# Patient Record
Sex: Male | Born: 1955 | Race: White | Hispanic: No | Marital: Married | Smoking: Former smoker
Health system: Southern US, Community
[De-identification: ages and names within clinical notes are randomized; demographics above are authoritative.]

## PROBLEM LIST (undated history)

## (undated) DIAGNOSIS — I10 Essential (primary) hypertension: Secondary | ICD-10-CM

## (undated) DIAGNOSIS — I209 Angina pectoris, unspecified: Secondary | ICD-10-CM

## (undated) DIAGNOSIS — I219 Acute myocardial infarction, unspecified: Secondary | ICD-10-CM

## (undated) DIAGNOSIS — K219 Gastro-esophageal reflux disease without esophagitis: Secondary | ICD-10-CM

## (undated) DIAGNOSIS — G4730 Sleep apnea, unspecified: Secondary | ICD-10-CM

## (undated) HISTORY — PX: ROTATOR CUFF REPAIR: SHX139

## (undated) HISTORY — PX: JOINT REPLACEMENT: SHX530

---

## 1998-07-01 ENCOUNTER — Encounter: Payer: Self-pay | Admitting: Internal Medicine

## 1998-07-01 ENCOUNTER — Ambulatory Visit: Admission: RE | Admit: 1998-07-01 | Discharge: 1998-07-01 | Payer: Self-pay | Admitting: Internal Medicine

## 1999-06-15 ENCOUNTER — Emergency Department (HOSPITAL_COMMUNITY): Admission: EM | Admit: 1999-06-15 | Discharge: 1999-06-15 | Payer: Self-pay | Admitting: Family Medicine

## 1999-06-15 ENCOUNTER — Encounter: Payer: Self-pay | Admitting: Family Medicine

## 2000-04-30 ENCOUNTER — Emergency Department (HOSPITAL_COMMUNITY): Admission: EM | Admit: 2000-04-30 | Discharge: 2000-04-30 | Payer: Self-pay | Admitting: Emergency Medicine

## 2000-04-30 ENCOUNTER — Encounter: Payer: Self-pay | Admitting: Emergency Medicine

## 2000-08-08 HISTORY — PX: CORONARY STENT PLACEMENT: SHX1402

## 2001-03-05 ENCOUNTER — Encounter: Payer: Self-pay | Admitting: Internal Medicine

## 2001-03-05 ENCOUNTER — Inpatient Hospital Stay (HOSPITAL_COMMUNITY): Admission: EM | Admit: 2001-03-05 | Discharge: 2001-03-08 | Payer: Self-pay | Admitting: Emergency Medicine

## 2001-03-15 ENCOUNTER — Ambulatory Visit (HOSPITAL_COMMUNITY): Admission: RE | Admit: 2001-03-15 | Discharge: 2001-03-15 | Payer: Self-pay | Admitting: Cardiology

## 2001-03-15 ENCOUNTER — Encounter: Payer: Self-pay | Admitting: Cardiology

## 2002-01-09 ENCOUNTER — Ambulatory Visit (HOSPITAL_COMMUNITY): Admission: RE | Admit: 2002-01-09 | Discharge: 2002-01-09 | Payer: Self-pay | Admitting: Urology

## 2002-03-25 ENCOUNTER — Encounter: Payer: Self-pay | Admitting: *Deleted

## 2002-03-25 ENCOUNTER — Inpatient Hospital Stay (HOSPITAL_COMMUNITY): Admission: EM | Admit: 2002-03-25 | Discharge: 2002-03-27 | Payer: Self-pay | Admitting: *Deleted

## 2004-07-12 ENCOUNTER — Ambulatory Visit: Payer: Self-pay | Admitting: Cardiology

## 2004-07-14 ENCOUNTER — Ambulatory Visit: Payer: Self-pay | Admitting: Cardiology

## 2004-07-29 ENCOUNTER — Ambulatory Visit: Payer: Self-pay

## 2004-09-06 ENCOUNTER — Ambulatory Visit: Payer: Self-pay | Admitting: Cardiology

## 2004-09-09 ENCOUNTER — Ambulatory Visit: Payer: Self-pay | Admitting: Cardiology

## 2004-12-06 ENCOUNTER — Ambulatory Visit: Payer: Self-pay | Admitting: *Deleted

## 2004-12-09 ENCOUNTER — Ambulatory Visit: Payer: Self-pay | Admitting: Cardiology

## 2005-03-28 ENCOUNTER — Ambulatory Visit: Payer: Self-pay | Admitting: Cardiology

## 2005-03-31 ENCOUNTER — Ambulatory Visit: Payer: Self-pay | Admitting: Cardiology

## 2005-05-23 ENCOUNTER — Ambulatory Visit: Payer: Self-pay | Admitting: Cardiology

## 2005-05-26 ENCOUNTER — Ambulatory Visit: Payer: Self-pay | Admitting: Cardiology

## 2005-06-13 ENCOUNTER — Ambulatory Visit: Payer: Self-pay | Admitting: Internal Medicine

## 2005-11-07 ENCOUNTER — Ambulatory Visit: Payer: Self-pay | Admitting: Cardiology

## 2005-11-15 ENCOUNTER — Ambulatory Visit: Payer: Self-pay | Admitting: Cardiology

## 2006-02-28 ENCOUNTER — Ambulatory Visit: Payer: Self-pay | Admitting: Cardiology

## 2006-03-02 ENCOUNTER — Ambulatory Visit: Payer: Self-pay | Admitting: Internal Medicine

## 2006-06-26 ENCOUNTER — Inpatient Hospital Stay (HOSPITAL_COMMUNITY): Admission: EM | Admit: 2006-06-26 | Discharge: 2006-06-27 | Payer: Self-pay | Admitting: Emergency Medicine

## 2006-06-26 ENCOUNTER — Ambulatory Visit: Payer: Self-pay | Admitting: Cardiology

## 2006-07-13 ENCOUNTER — Ambulatory Visit: Payer: Self-pay | Admitting: Cardiology

## 2006-08-29 ENCOUNTER — Ambulatory Visit: Payer: Self-pay | Admitting: Cardiology

## 2006-08-29 LAB — CONVERTED CEMR LAB
AST: 34 units/L (ref 0–37)
Albumin: 4.6 g/dL (ref 3.5–5.2)
Alkaline Phosphatase: 117 units/L (ref 39–117)
Cholesterol: 99 mg/dL (ref 0–200)
HDL: 43.2 mg/dL (ref >39.0)
LDL Cholesterol: 47 mg/dL (ref 0–99)
Total Bilirubin: 1.1 mg/dL (ref 0.3–1.2)
Total Protein: 8 g/dL (ref 6.0–8.3)

## 2006-08-31 ENCOUNTER — Ambulatory Visit: Payer: Self-pay | Admitting: Internal Medicine

## 2006-12-11 ENCOUNTER — Ambulatory Visit: Payer: Self-pay | Admitting: Cardiology

## 2006-12-11 LAB — CONVERTED CEMR LAB
Bilirubin, Direct: 0.2 mg/dL (ref 0.0–0.3)
Cholesterol: 97 mg/dL (ref 0–200)
HDL: 37.4 mg/dL — ABNORMAL LOW (ref >39.0)
Total CHOL/HDL Ratio: 2.6
VLDL: 11 mg/dL (ref 0–40)

## 2006-12-14 ENCOUNTER — Ambulatory Visit: Payer: Self-pay | Admitting: Cardiology

## 2007-05-16 ENCOUNTER — Ambulatory Visit (HOSPITAL_COMMUNITY): Admission: RE | Admit: 2007-05-16 | Discharge: 2007-05-16 | Payer: Self-pay | Admitting: Orthopaedic Surgery

## 2007-06-18 ENCOUNTER — Ambulatory Visit: Payer: Self-pay | Admitting: Cardiology

## 2007-06-18 LAB — CONVERTED CEMR LAB
ALT: 62 units/L — ABNORMAL HIGH (ref 0–53)
AST: 41 units/L — ABNORMAL HIGH (ref 0–37)
Albumin: 4.4 g/dL (ref 3.5–5.2)
HDL: 43.6 mg/dL (ref >39.0)
Total CHOL/HDL Ratio: 2.3
Total Protein: 7.4 g/dL (ref 6.0–8.3)
Triglycerides: 49 mg/dL (ref 0–149)
VLDL: 10 mg/dL (ref 0–40)

## 2007-06-21 ENCOUNTER — Ambulatory Visit: Payer: Self-pay | Admitting: Cardiology

## 2007-07-17 ENCOUNTER — Ambulatory Visit: Payer: Self-pay | Admitting: Cardiology

## 2007-12-03 ENCOUNTER — Ambulatory Visit: Payer: Self-pay | Admitting: Cardiology

## 2007-12-03 LAB — CONVERTED CEMR LAB
Albumin: 4.2 g/dL (ref 3.5–5.2)
Alkaline Phosphatase: 98 units/L (ref 39–117)
Bilirubin, Direct: 0.1 mg/dL (ref 0.0–0.3)
Cholesterol: 107 mg/dL (ref 0–200)
LDL Cholesterol: 57 mg/dL (ref 0–99)

## 2007-12-06 ENCOUNTER — Ambulatory Visit: Payer: Self-pay | Admitting: Cardiology

## 2008-06-02 ENCOUNTER — Ambulatory Visit: Payer: Self-pay | Admitting: Cardiology

## 2008-06-02 LAB — CONVERTED CEMR LAB
Cholesterol: 104 mg/dL (ref 0–200)
HDL: 37.8 mg/dL — ABNORMAL LOW (ref >39.0)
LDL Cholesterol: 57 mg/dL (ref 0–99)
Total Bilirubin: 1.2 mg/dL (ref 0.3–1.2)
Total CHOL/HDL Ratio: 2.8
VLDL: 9 mg/dL (ref 0–40)

## 2008-06-05 ENCOUNTER — Ambulatory Visit: Payer: Self-pay | Admitting: Cardiovascular Disease

## 2008-07-10 ENCOUNTER — Ambulatory Visit: Payer: Self-pay | Admitting: Cardiology

## 2008-12-01 ENCOUNTER — Ambulatory Visit: Payer: Self-pay | Admitting: Cardiology

## 2008-12-01 LAB — CONVERTED CEMR LAB
ALT: 34 units/L (ref 0–53)
AST: 27 units/L (ref 0–37)
Albumin: 4.4 g/dL (ref 3.5–5.2)
Alkaline Phosphatase: 102 units/L (ref 39–117)
Bilirubin, Direct: 0.2 mg/dL (ref 0.0–0.3)
Cholesterol: 99 mg/dL (ref 0–200)
HDL: 38.3 mg/dL — ABNORMAL LOW (ref >39.00)
LDL Cholesterol: 52 mg/dL (ref 0–99)
Total Bilirubin: 0.9 mg/dL (ref 0.3–1.2)
Total CHOL/HDL Ratio: 3
Total Protein: 7.1 g/dL (ref 6.0–8.3)
Triglycerides: 43 mg/dL (ref 0.0–149.0)
VLDL: 8.6 mg/dL (ref 0.0–40.0)

## 2008-12-11 ENCOUNTER — Ambulatory Visit: Payer: Self-pay | Admitting: Internal Medicine

## 2008-12-15 ENCOUNTER — Encounter: Payer: Self-pay | Admitting: Cardiology

## 2008-12-15 DIAGNOSIS — I10 Essential (primary) hypertension: Secondary | ICD-10-CM | POA: Insufficient documentation

## 2008-12-15 DIAGNOSIS — E785 Hyperlipidemia, unspecified: Secondary | ICD-10-CM

## 2008-12-15 DIAGNOSIS — I2510 Atherosclerotic heart disease of native coronary artery without angina pectoris: Secondary | ICD-10-CM | POA: Insufficient documentation

## 2008-12-15 DIAGNOSIS — E663 Overweight: Secondary | ICD-10-CM | POA: Insufficient documentation

## 2009-03-18 ENCOUNTER — Telehealth (INDEPENDENT_AMBULATORY_CARE_PROVIDER_SITE_OTHER): Payer: Self-pay | Admitting: *Deleted

## 2009-06-02 ENCOUNTER — Ambulatory Visit: Payer: Self-pay | Admitting: Cardiology

## 2009-06-03 LAB — CONVERTED CEMR LAB
ALT: 42 units/L (ref 0–53)
AST: 31 units/L (ref 0–37)
Albumin: 4.1 g/dL (ref 3.5–5.2)
Alkaline Phosphatase: 102 units/L (ref 39–117)
Bilirubin, Direct: 0.2 mg/dL (ref 0.0–0.3)
LDL Cholesterol: 44 mg/dL (ref 0–99)
Total Bilirubin: 1 mg/dL (ref 0.3–1.2)
Total CHOL/HDL Ratio: 2
Total Protein: 7.3 g/dL (ref 6.0–8.3)

## 2009-06-04 ENCOUNTER — Ambulatory Visit: Payer: Self-pay | Admitting: Cardiology

## 2009-07-10 ENCOUNTER — Ambulatory Visit: Payer: Self-pay | Admitting: Cardiology

## 2009-08-25 ENCOUNTER — Ambulatory Visit: Payer: Self-pay | Admitting: Cardiology

## 2009-08-27 ENCOUNTER — Ambulatory Visit: Payer: Self-pay | Admitting: Cardiology

## 2009-08-28 LAB — CONVERTED CEMR LAB
Cholesterol: 156 mg/dL (ref 0–200)
Total Bilirubin: 0.9 mg/dL (ref 0.3–1.2)
Total CHOL/HDL Ratio: 4
Total Protein: 7.2 g/dL (ref 6.0–8.3)
VLDL: 13.4 mg/dL (ref 0.0–40.0)

## 2009-10-08 ENCOUNTER — Inpatient Hospital Stay (HOSPITAL_COMMUNITY): Admission: RE | Admit: 2009-10-08 | Discharge: 2009-10-10 | Payer: Self-pay | Admitting: Orthopaedic Surgery

## 2010-02-04 ENCOUNTER — Inpatient Hospital Stay (HOSPITAL_COMMUNITY): Admission: RE | Admit: 2010-02-04 | Discharge: 2010-02-06 | Payer: Self-pay | Admitting: Orthopaedic Surgery

## 2010-04-05 ENCOUNTER — Telehealth (INDEPENDENT_AMBULATORY_CARE_PROVIDER_SITE_OTHER): Payer: Self-pay | Admitting: *Deleted

## 2010-04-05 ENCOUNTER — Ambulatory Visit: Payer: Self-pay | Admitting: Cardiology

## 2010-04-07 ENCOUNTER — Ambulatory Visit: Payer: Self-pay | Admitting: Internal Medicine

## 2010-04-07 DIAGNOSIS — G4733 Obstructive sleep apnea (adult) (pediatric): Secondary | ICD-10-CM | POA: Insufficient documentation

## 2010-04-07 DIAGNOSIS — J309 Allergic rhinitis, unspecified: Secondary | ICD-10-CM | POA: Insufficient documentation

## 2010-04-07 LAB — CONVERTED CEMR LAB
ALT: 37 units/L (ref 0–53)
AST: 34 units/L (ref 0–37)
Albumin: 4.3 g/dL (ref 3.5–5.2)
Alkaline Phosphatase: 105 units/L (ref 39–117)
Bilirubin, Direct: 0.1 mg/dL (ref 0.0–0.3)
HDL: 50.5 mg/dL (ref >39.00)
LDL Cholesterol: 66 mg/dL (ref 0–99)
Total Protein: 6.9 g/dL (ref 6.0–8.3)

## 2010-04-08 ENCOUNTER — Ambulatory Visit: Payer: Self-pay | Admitting: Internal Medicine

## 2010-04-27 ENCOUNTER — Encounter: Payer: Self-pay | Admitting: Internal Medicine

## 2010-06-27 IMAGING — CR DG CHEST 2V
2 series · 2 of 2 positions shown · non-contrast
Comparison: 06/26/2006

CLINICAL DATA: Preop for left total knee replacement.
Hypertension.

CHEST - 2 VIEW

[view not recorded (1 of 2)]
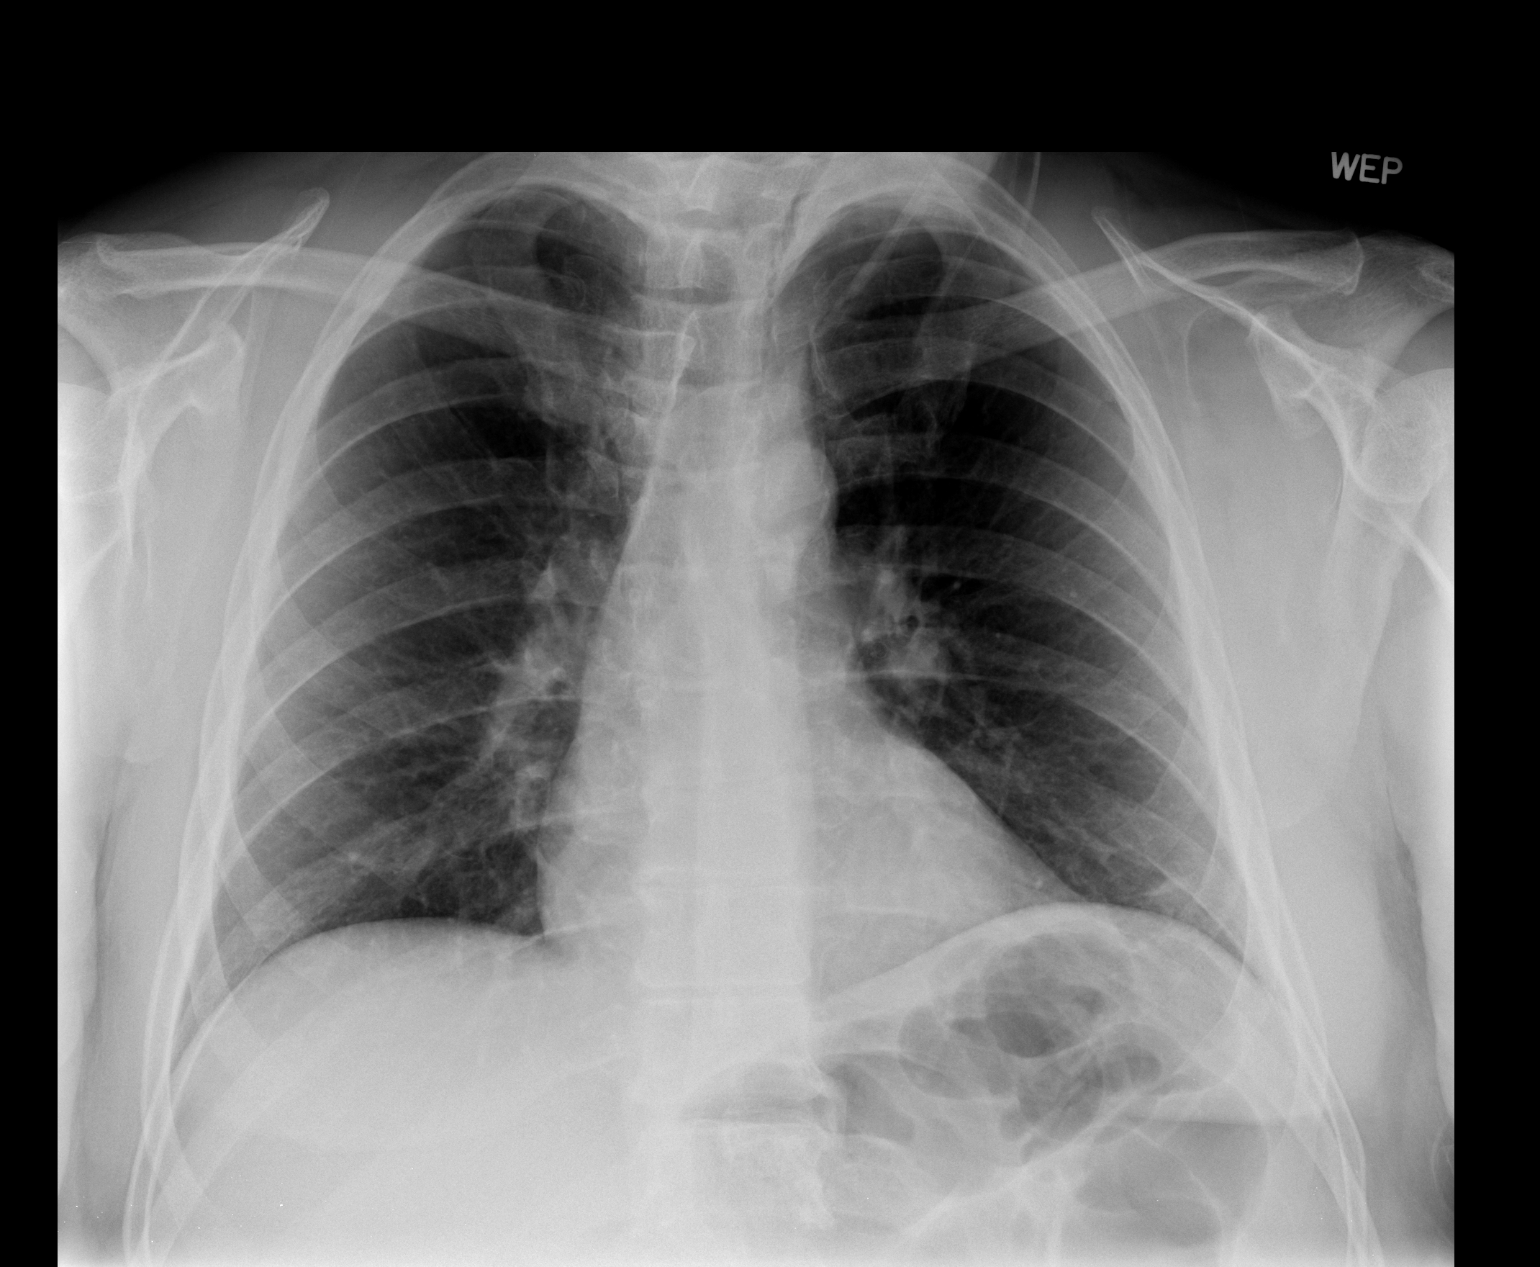

[view not recorded (2 of 2)]
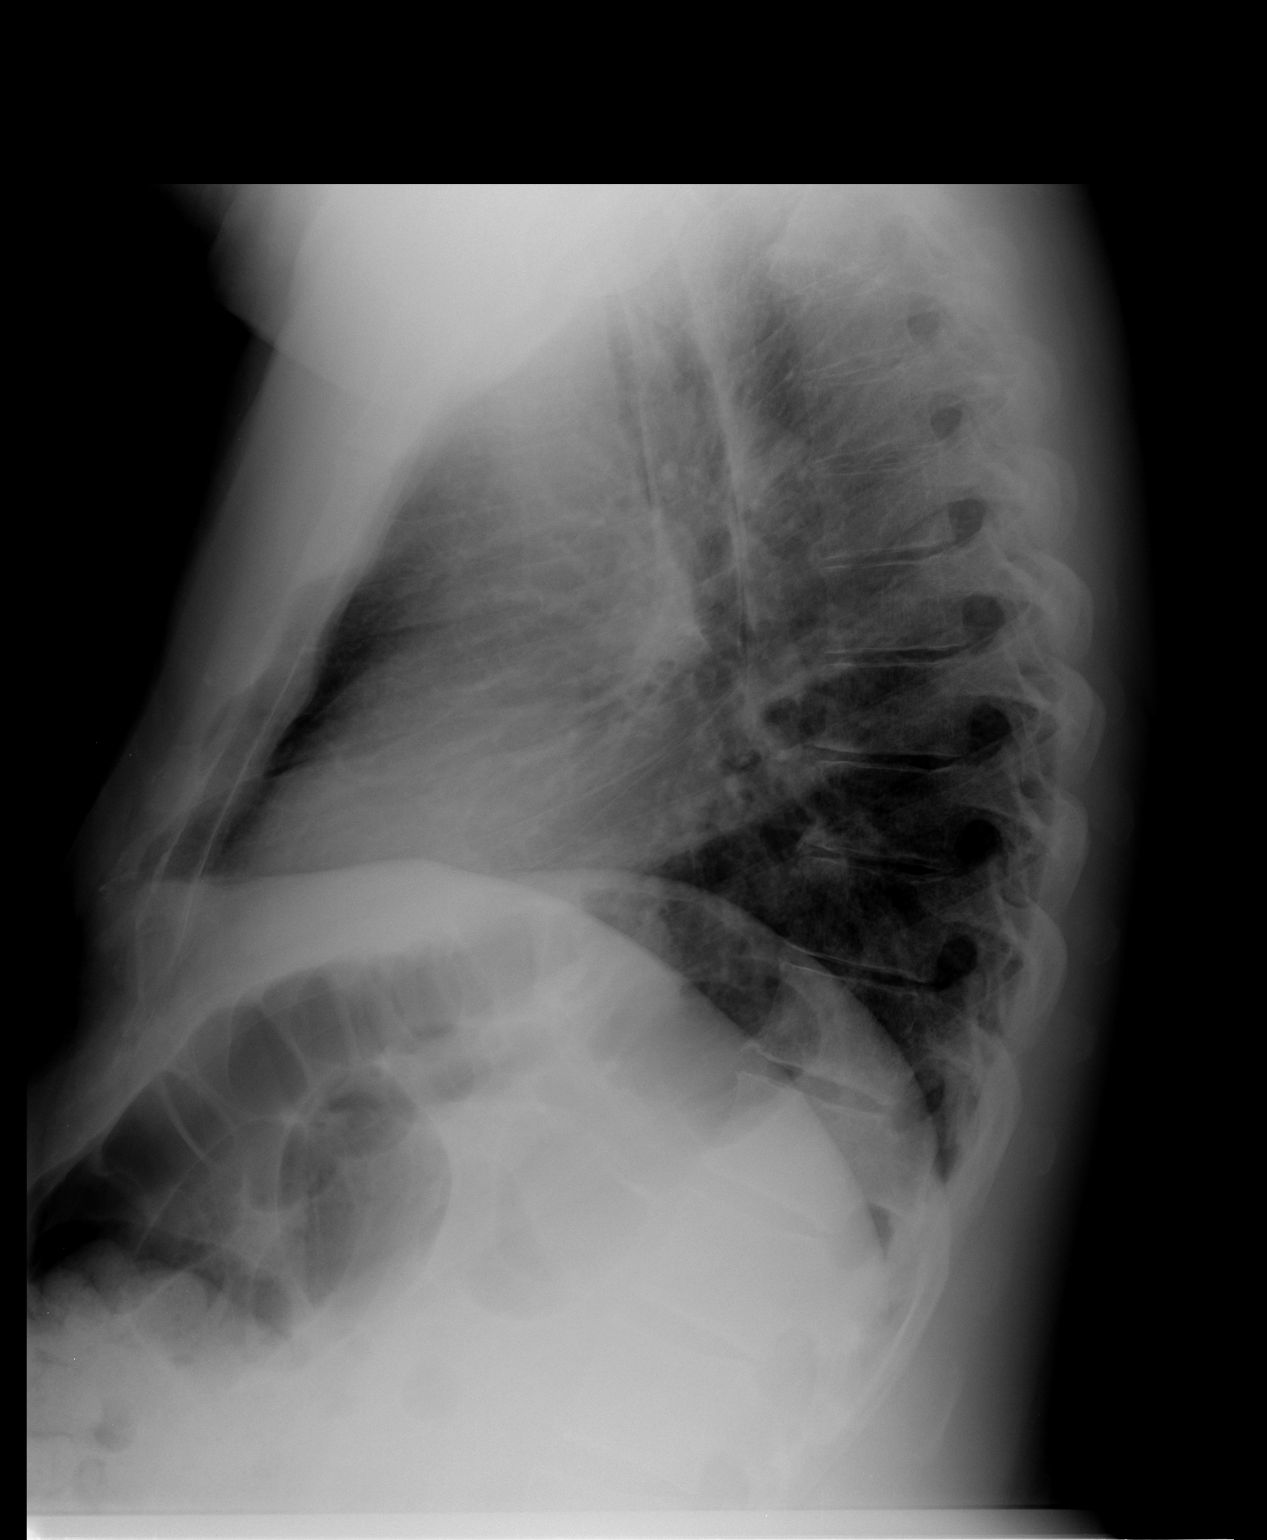

[2 of 2 positions shown; findings below may reference images not displayed]

FINDINGS: Mild lower thoracic spondylosis.  Patient rotated to the
left. Midline trachea.  Normal heart size and mediastinal contours.
No pleural effusion or pneumothorax.  Minimal left base linear scar
or atelectasis.  Right lung clear.
IMPRESSION: No acute cardiopulmonary disease.

## 2010-06-29 ENCOUNTER — Telehealth: Payer: Self-pay | Admitting: Cardiology

## 2010-07-05 ENCOUNTER — Ambulatory Visit: Payer: Self-pay | Admitting: Cardiology

## 2010-07-05 LAB — CONVERTED CEMR LAB
Basophils Relative: 0.4 % (ref 0.0–3.0)
Eosinophils Absolute: 0.2 10*3/uL (ref 0.0–0.7)
Eosinophils Relative: 3.1 % (ref 0.0–5.0)
Glucose, Bld: 114 mg/dL — ABNORMAL HIGH (ref 70–99)
HCT: 35.1 % — ABNORMAL LOW (ref 39.0–52.0)
Lymphocytes Relative: 32.5 % (ref 12.0–46.0)
Lymphs Abs: 1.9 10*3/uL (ref 0.7–4.0)
MCV: 94.1 fL (ref 78.0–100.0)
Monocytes Relative: 7.3 % (ref 3.0–12.0)
Neutro Abs: 3.2 10*3/uL (ref 1.4–7.7)
Neutrophils Relative %: 56.7 % (ref 43.0–77.0)
Platelets: 217 10*3/uL (ref 150.0–400.0)
Sodium: 140 meq/L (ref 135–145)
WBC: 5.7 10*3/uL (ref 4.5–10.5)

## 2010-07-09 ENCOUNTER — Encounter: Payer: Self-pay | Admitting: Cardiology

## 2010-07-09 ENCOUNTER — Ambulatory Visit: Payer: Self-pay | Admitting: Cardiology

## 2010-09-09 NOTE — Assessment & Plan Note (Signed)
Summary: re establish-former [facility] pt/kcw   Copy to:  Charlies Constable Primary Provider/Referring Provider:  / Elizabeth Palau  CC:  Re establish-CPAP supplies needed.Marland Kitchen  History of Present Illness: April 07, 2010- 55 yo M former patient coming to re-establish for Sleep Apnea, needing replacement of old machine and mask.  I made original dx in 1999 with NPSG then RDI 32/hr. CPAP was titrated to 10/ [facility], nasal mask. Has humidifier used mostly in the winter. Current machine is 53 years old and not running well anymore.  Sleeps ok, Wife doesn't report snore and he denes daytime sleepiness. No hx ENT surgery or thyroid. Had MI/ stent 2002- Dr Juanda Chance.  Several orthopedic surgeries without reported respiratory complication.   Preventive Screening-Counseling & Management  Alcohol-Tobacco     Alcohol drinks/day: 0     Smoking Status: quit     Packs/Day: 0.5     Year Started: 1980     Year Quit: 1990  Current Medications (verified): 1)  Niaspan 1000 Mg Cr-Tabs (Niacin (Antihyperlipidemic)) .... Take 2 Tablet By Mouth At Bedtime 2)  Nitroglycerin 0.4 Mg Subl (Nitroglycerin) 3)  Pantoprazole Sodium 40 Mg Tbec (Pantoprazole Sodium) .Marland Kitchen.. 1 Tab Once Daily 4)  Lipitor 40 Mg Tabs (Atorvastatin Calcium) .... Take One Tablet By Mouth Daily. 5)  Aspirin Ec 325 Mg Tbec (Aspirin) .... Take One Tablet By Mouth Daily 6)  Ramipril 10 Mg Caps (Ramipril) .... Take One Capsule By Mouth Daily 7)  Fexofenadine Hcl 180 Mg Tabs (Fexofenadine Hcl) .Marland Kitchen.. 1 Once Daily 8)  Uroxatral 10 Mg Tb24 (Alfuzosin Hcl) .Marland Kitchen.. 1 By Mouth Daily 9)  Allopurinol 100 Mg Tabs (Allopurinol) .Marland Kitchen.. 1 By Mouth Daily 10)  Fibercon 625 Mg Tabs (Calcium Polycarbophil) .Marland Kitchen.. 1 - 2 Tabs Daily 11)  Vitamin C Cr 1000 Mg Cr-Tabs (Ascorbic Acid) .... 1.5 Tabs Daily 12)  Zetia 10 Mg Tabs (Ezetimibe) .... Take One Tablet By Mouth Daily. 13)  Cpap .... Set On 10 Ahc 14)  Centrum Silver  Tabs (Multiple Vitamins-Minerals) .... Take 1 By Mouth Once  Daily 15)  Tylenol With Codeine #3 300-30 Mg Tabs (Acetaminophen-Codeine) .... Take 1-2 By Mouth Once Daily or As Directed 16)  Stool Softener 100 Mg Caps (Docusate Sodium) .... Take 1 By Mouth Three Times A Day As Needed 17)  Sudafed 30 Mg Tabs (Pseudoephedrine Hcl) .... Take 1-2 As Needed  Allergies (verified): 1)  Sulfamethoxazole (Sulfamethoxazole)  Past History:  Family History: Last updated: 04/07/2010 Brother:  early coronary artery disease (cabg at 35 yo) Father- died Lung cancer/ smoker Mother- died Alzheimer's Family hx of Heart Disease and Cancer.  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   Former smoker quit over 15 years ago.   Married/ children  Risk Factors: Alcohol Use: 0 (04/07/2010)  Risk Factors: Smoking Status: quit (04/07/2010) Packs/Day: 0.5 (04/07/2010)  Past Medical History: CAD: 2002 angioplasty and bare metal stenting to 99% mid - LAD and 80% distal RCA lesions.            follow-up cath in 2007 demonstrated non-obstructive disease.   MI 2002 Hyperlipidemia: followed in lipid clinic on triple therapy Hypertension: controlled with single-agent therapy Obstructive sleep Apnea NPSG 1999, AHI 32/hr- CPAP Allergic rhinitis  Past Surgical History: arthroscopic surgery knees x 5 Knee replacement Left 10/2009, Right 01/2010 Rotator cuff Right 2007 Coronary stent 2002  Family History: Brother:  early coronary artery disease (cabg at 41 yo) Father- died Lung cancer/ smoker  Mother- died Alzheimer's Family hx of Heart Disease and Cancer.  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   Former smoker quit over 15 years ago.   Married/ childrenSmoking Status:  quit Packs/Day:  0.5 Alcohol drinks/day:  0  Review of Systems  The patient denies shortness of breath with activity, shortness of breath at rest, productive cough,  non-productive cough, coughing up blood, chest pain, irregular heartbeats, acid heartburn, indigestion, loss of appetite, weight change, abdominal pain, difficulty swallowing, sore throat, tooth/dental problems, headaches, nasal congestion/difficulty breathing through nose, sneezing, itching, ear ache, anxiety, depression, hand/feet swelling, joint stiffness or pain, rash, change in color of mucus, and fever.    Vital Signs:  Patient profile:   55 year old male Height:      70.5 inches Weight:      237.38 pounds BMI:     33.70 O2 Sat:      99 % on Room air Pulse rate:   84 / minute BP sitting:   100 / 60  (left arm) Cuff size:   large  Vitals Entered By: Reynaldo Minium CMA (April 07, 2010 11:39 AM)  O2 Flow:  Room air CC: Re establish-CPAP supplies needed.   Physical Exam  Additional Exam:  General: A/Ox3; pleasant and cooperative, NAD, medium build SKIN: no rash, lesions NODES: no lymphadenopathy HEENT: [place]/AT, EOM- WNL, Conjuctivae- clear, PERRLA, TM-WNL, Nose- clear, Throat- clear and wnl, Mallampati  IV NECK: Supple w/ fair ROM, JVD- none, normal carotid impulses w/o bruits Thyroid- normal to palpation CHEST: Clear to P&A HEART: RRR, no m/g/r heard ABDOMEN: Soft and nl; nml bowel sounds; no organomegaly or masses noted ZOX:WRUE, nl pulses, no edema. Bilateral knee scars  NEURO: Grossly intact to observation, alert      Impression & Recommendations:  Problem # 1:  OBSTRUCTIVE SLEEP APNEA (ICD-327.23) Moderately severe obstructive sleep apnea. Weight and sleep hygiene are ok. Discussed treatment options, driving responsibility. We discussed interaction of untreated OSA with his hypertension and coronary disease.  Great complaince and control. Pressure seems appropriate. We will help to replace worn out machine and mask, but don't need to change what he is doing.   Problem # 2:  ALLERGIC RHINITIS (ICD-477.9) Currently doing well without symptoms of nasal obstruction. He  has not been using sedating antihistamines. Can address this seasonally if needed. Flu vaccine requested for this year.  His updated medication list for this problem includes:    Fexofenadine Hcl 180 Mg Tabs (Fexofenadine hcl) .Marland Kitchen... 1 once daily    Sudafed 30 Mg Tabs (Pseudoephedrine hcl) .Marland Kitchen... Take 1-2 as needed  Medications Added to Medication List This Visit: 1)  Cpap  .... Set on 10 ahc 2)  Cpap- Replacement Machine 10 Cwp  .... Mask of choice humidifier supplies 3)  Centrum Silver Tabs (Multiple vitamins-minerals) .... Take 1 by mouth once daily 4)  Tylenol With Codeine #3 300-30 Mg Tabs (Acetaminophen-codeine) .... Take 1-2 by mouth once daily or as directed 5)  Stool Softener 100 Mg Caps (Docusate sodium) .... Take 1 by mouth three times a day as needed 6)  Sudafed 30 Mg Tabs (Pseudoephedrine hcl) .... Take 1-2 as needed  Other Orders: New Patient Level IV (45409) Admin 1st Vaccine (81191) Flu Vaccine 109yrs + (47829)  Patient Instructions: 1)  Please schedule a follow-up appointment in 1 year. 2)  Script for replacement CPAP machine and mask 3)  Flu vaccine given Prescriptions: CPAP- REPLACEMENT MACHINE 10 CWP mask of  choice humidifier supplies  #1 x prn   Entered and Authorized by:   Waymon Budge MD   Signed by:   Waymon Budge MD on 04/07/2010   Method used:   Print then Give to Patient   RxID:   [id number]  Flu Vaccine Consent Questions     Do you have a history of severe allergic reactions to this vaccine? no    Any prior history of allergic reactions to egg and/or gelatin? no    Do you have a sensitivity to the preservative Thimersol? no    Do you have a past history of Guillan-Barre Syndrome? no    Do you currently have an acute febrile illness? no    Have you ever had a severe reaction to latex? no    Vaccine information given and explained to patient? yes    Are you currently pregnant? no    Lot Number:[id number]   Exp Date:02/05/2011   Site Given   Left Deltoid IMd by:   Waymon Budge MD on 04/07/2010   Method used:   Print then Give to Patient   RxID:   [id number]    .lbflu

## 2010-09-09 NOTE — Assessment & Plan Note (Signed)
Summary: rov lipid- -lmc   Referring Provider:  Charlies Constable Primary Provider:  / Elizabeth Palau  CC:  dyslipidemia follow-up.  History of Present Illness:  Lipid Clinic Visit      The patient comes in today for dyslipidemia follow-up.  The patient has no complaints of chest pain, palpitations, shortness of breath, peripheral edema, muscle aches, and muscle cramps.  Dietary compliance review reveals an overall grade of eating 5 or more fruits and vegetables, not counting carbohydrates, but  limiting fats and TFA's.  Review of exercise habits reveals that the patient is biking.  Compliance with medication is good.    63 -year old patient with history of CAD s/p bare-metal stenting to LAD and RCA in 2002.  This pleasant gentleman remains compliant with his medications, exercise regimen (2 - 3 days / week), and diet therapy.  Drug therapy includes triple therapy for focused lipid-lowering therapy.  The patient denies complications related to these medications at this time.    s/p b/l knee replacement and improving knee pain.  He has restarted exercise,,,   Preventive Screening-Counseling & Management  Alcohol-Tobacco     Alcohol drinks/day: 0     Smoking Status: quit > 6 months  Current Medications (verified): 1)  Niaspan 1000 Mg Cr-Tabs (Niacin (Antihyperlipidemic)) .... Take 2 Tablet By Mouth At Bedtime 2)  Nitroglycerin 0.4 Mg Subl (Nitroglycerin) 3)  Pantoprazole Sodium 40 Mg Tbec (Pantoprazole Sodium) .Marland Kitchen.. 1 Tab Once Daily 4)  Lipitor 40 Mg Tabs (Atorvastatin Calcium) .... Take One Tablet By Mouth Daily. 5)  Aspirin Ec 325 Mg Tbec (Aspirin) .... Take One Tablet By Mouth Daily 6)  Ramipril 10 Mg Caps (Ramipril) .... Take One Capsule By Mouth Daily 7)  Fexofenadine Hcl 180 Mg Tabs (Fexofenadine Hcl) .Marland Kitchen.. 1 Once Daily 8)  Uroxatral 10 Mg Tb24 (Alfuzosin Hcl) .Marland Kitchen.. 1 By Mouth Daily 9)  Allopurinol 100 Mg Tabs (Allopurinol) .Marland Kitchen.. 1 By Mouth Daily 10)  Fibercon 625 Mg Tabs (Calcium  Polycarbophil) .Marland Kitchen.. 1 - 2 Tabs Daily 11)  Vitamin C Cr 1000 Mg Cr-Tabs (Ascorbic Acid) .... 1.5 Tabs Daily 12)  Zetia 10 Mg Tabs (Ezetimibe) .... Take One Tablet By Mouth Daily. 13)  Cpap .... Set On 10 Ahc 14)  Cpap- Replacement Machine 10 Cwp .... Mask of Choice Humidifier Supplies 15)  Centrum Silver  Tabs (Multiple Vitamins-Minerals) .... Take 1 By Mouth Once Daily 16)  Tylenol With Codeine #3 300-30 Mg Tabs (Acetaminophen-Codeine) .... Take 1-2 By Mouth Once Daily or As Directed 17)  Stool Softener 100 Mg Caps (Docusate Sodium) .... Take 1 By Mouth Three Times A Day As Needed 18)  Sudafed 30 Mg Tabs (Pseudoephedrine Hcl) .... Take 1-2 As Needed  Allergies: 1)  Sulfamethoxazole (Sulfamethoxazole)  Social History: Smoking Status:  quit > 6 months   Vital Signs:  Patient profile:   55 year old male Height:      70.5 inches Weight:      237.38 pounds Pulse rate:   96 / minute Pulse rhythm:   regular BP sitting:   142 / 80  (right arm) Cuff size:   regular  Impression & Recommendations:  Problem # 1:  HYPERLIPIDEMIA-MIXED (ICD-272.4)  His updated medication list for this problem includes:    Niaspan 1000 Mg Cr-tabs (Niacin (antihyperlipidemic)) .Marland Kitchen... Take 2 tablet by mouth at bedtime    Lipitor 40 Mg Tabs (Atorvastatin calcium) .Marland Kitchen... Take one tablet by mouth daily.    Zetia 10 Mg Tabs (Ezetimibe) .Marland Kitchen... Take one  tablet by mouth daily.   Mr Anastasio Auerbach returns to lipid clinic with no complaints.  He is compliant with meds.  He has recently had a b/l knee replacement and is recovering well.  He has started biking 3 miles/day on a stationary bike.  He has maintained a smomewhat healthy diet.  He eats some sweet snacks but has decreased by hals since earlier in the yr.  he eats either a nutrigrain bar or a poptart for breakfast, sandwich for lunch and healthy home cooked meal.  Ihave encouraged him to decrease portion size of snacks and meals, and limit pop tarts.  I have commended him  on his restart of exercise and hope htis continues.  Lipid panel is at goal and LFT WNL. f/u 79mo  stress helathy lifestyle

## 2010-09-09 NOTE — Assessment & Plan Note (Signed)
Summary: f1y   Visit Type:  1 yr f/u Referring Provider:  Charlies Constable Primary Provider:   Elizabeth Palau  CC:  pt offers no cardiac complaints today.....  History of Present Illness:  [name] [name] is 55 years old and return for management of CAD. In 2002 he had bare-metal stents placed to the LAD and RCA by Dr. Gerri Spore. In 2007 he had catheterization which showed nonobstructive CAD. He has done well since that time from the standpoint of his heart with no chest pain shortness of breath or palpitations.  His other problems include hypertension hyperlipidemia and excess weight. Had a lipid profile in August of 2011 which showed an LDL of 66, an HDL of 50, and a total cholesterol of 128.  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 He lost his job several months ago and is currently unemployed.  Current Medications (verified): 1)  Niaspan 1000 Mg Cr-Tabs (Niacin (Antihyperlipidemic)) .... Take 2 Tablet By Mouth At Bedtime 2)  Nitrostat 0.4 Mg Subl (Nitroglycerin) .Marland Kitchen.. 1 Tablet Under Tongue At Onset of Chest Pain; You May Repeat Every 5 Minutes For Up To 3 Doses. 3)  Pantoprazole Sodium 40 Mg Tbec (Pantoprazole Sodium) .Marland Kitchen.. 1 Tab Once Daily 4)  Lipitor 40 Mg Tabs (Atorvastatin Calcium) .... Take One Tablet By Mouth Daily. 5)  Aspirin Ec 325 Mg Tbec (Aspirin) .... Take One Tablet By Mouth Daily 6)  Ramipril 10 Mg Caps (Ramipril) .... Take One Capsule By Mouth Daily 7)  Fexofenadine Hcl 180 Mg Tabs (Fexofenadine Hcl) .Marland Kitchen.. 1 Once Daily 8)  Uroxatral 10 Mg Tb24 (Alfuzosin Hcl) .Marland Kitchen.. 1 By Mouth Daily 9)  Allopurinol 100 Mg Tabs (Allopurinol) .Marland Kitchen.. 1 By Mouth Daily 10)  Fibercon 625 Mg Tabs (Calcium Polycarbophil) .Marland Kitchen.. 1 - 2 Tabs Daily 11)  Vitamin C Cr 1000 Mg Cr-Tabs (Ascorbic Acid) .... 1.5 Tabs Daily 12)  Zetia 10 Mg Tabs (Ezetimibe) .... Take One Tablet By Mouth Daily. 13)  Cpap .... Set On 10 Ahc 14)  Cpap- Replacement  Machine 10 Cwp .... Mask of Choice Humidifier Supplies 15)  Centrum Silver  Tabs (Multiple Vitamins-Minerals) .... Take 1 By Mouth Once Daily 16)  Tylenol With Codeine #3 300-30 Mg Tabs (Acetaminophen-Codeine) .... Take 1-2 By Mouth Once Daily or As Directed 17)  Stool Softener 100 Mg Caps (Docusate Sodium) .... Take 1 By Mouth Three Times A Day As Needed 18)  Sudafed 30 Mg Tabs (Pseudoephedrine Hcl) .... Take 1-2 As Needed 19)  Etodolac 200 Mg Caps (Etodolac) .Marland Kitchen.. 1 Cap Once Daily  Allergies: 1)  Sulfamethoxazole (Sulfamethoxazole)  Past History:  Past Medical History: Reviewed history from 04/07/2010 and no changes required. CAD: 2002 angioplasty and bare metal stenting to 99% mid - LAD and 80% distal RCA lesions.            follow-up cath in 2007 demonstrated non-obstructive disease.   MI 2002 Hyperlipidemia: followed in lipid clinic on triple therapy Hypertension: controlled with single-agent therapy Obstructive sleep Apnea NPSG 1999, AHI 32/hr- CPAP Allergic rhinitis  Review of Systems       ROS is negative except as outlined in HPI.   Vital Signs:  Patient profile:   55 year old male Height:      70.5 inches Weight:      237.75 pounds BMI:     33.75 Pulse rate:   73 / minute Pulse rhythm:   irregular BP sitting:   104 / 72  (  left arm) Cuff size:   large  Vitals Entered By: Danielle Rankin, CMA (July 09, 2010 11:02 AM)  Physical Exam  Additional Exam:  Gen. Well-nourished, in no distress   Neck: No JVD, thyroid not enlarged, no carotid bruits Lungs: No tachypnea, clear without rales, rhonchi or wheezes Cardiovascular: Rhythm regular, PMI not displaced,  heart sounds  normal, no murmurs or gallops, no peripheral edema, pulses normal in all 4 extremities. Abdomen: BS normal, abdomen soft and non-tender without masses or organomegaly, no hepatosplenomegaly. MS: No deformities, no cyanosis or clubbing   Neuro:  No focal sns   Skin:  no lesions    Impression &  Recommendations:  Problem # 1:  Hosp for CAD, NATIVE VESSEL (ICD-414.01) He has had prior PCI procedures as described in history of present illness. He's had no chest pain this problem appears stable. His updated medication list for this problem includes:    Nitrostat 0.4 Mg Subl (Nitroglycerin) .Marland Kitchen... 1 tablet under tongue at onset of chest pain; you may repeat every 5 minutes for up to 3 doses.    Aspirin Ec 325 Mg Tbec (Aspirin) .Marland Kitchen... Take one tablet by mouth daily    Ramipril 10 Mg Caps (Ramipril) .Marland Kitchen... Take one capsule by mouth daily  Orders: EKG w/ Interpretation (93000)  Problem # 2:  HYPERTENSION, UNSPECIFIED (ICD-401.9) This is well-controlled on current medications. His updated medication list for this problem includes:    Aspirin Ec 325 Mg Tbec (Aspirin) .Marland Kitchen... Take one tablet by mouth daily    Ramipril 10 Mg Caps (Ramipril) .Marland Kitchen... Take one capsule by mouth daily  Problem # 3:  HYPERLIPIDEMIA-MIXED (ICD-272.4) This is managed with the 3 medications below. His most recent lipid profile was very good. His updated medication list for this problem includes:    Niaspan 1000 Mg Cr-tabs (Niacin (antihyperlipidemic)) .Marland Kitchen... Take 2 tablet by mouth at bedtime    Lipitor 40 Mg Tabs (Atorvastatin calcium) .Marland Kitchen... Take one tablet by mouth daily.    Zetia 10 Mg Tabs (Ezetimibe) .Marland Kitchen... Take one tablet by mouth daily.  Patient Instructions: 1)  Your physician recommends that you continue on your current medications as directed. Please refer to the Current Medication list given to you today. 2)  Your physician wants you to follow-up in: 1 year with Dr. Clifton James.  You will receive a reminder letter in the mail two months in advance. If you don't receive a letter, please call our office to schedule the follow-up appointment. Prescriptions: NITROSTAT 0.4 MG SUBL (NITROGLYCERIN) 1 tablet under tongue at onset of chest pain; you may repeat every 5 minutes for up to 3 doses.  #25 x 6   Entered by:   Danielle Rankin, CMA   Authorized by:   Lenoria Farrier, MD, [facility]   Signed by:   Danielle Rankin, CMA on 07/09/2010   Method used:   Electronically to        [facility]  [place] [address] (retail)       [address]       [place], [place]  [address]       Ph: [phone] or [phone]       Fax: [phone]   RxID:   [id number]

## 2010-09-09 NOTE — Progress Notes (Signed)
Summary: appt-PT RETURNED CALL  Phone Note Call from Patient Call back at Home Phone [phone]   Caller: Patient Call For: young Reason for Call: Talk to Nurse Summary of Call: former pt of [facility], wants to be seen this week by [facility].  Has some insurance changes.  Sleep pt. Initial call taken by: Eugene Gavia,  April 05, 2010 8:49 AM  Follow-up for Phone Call        lmtcb    Philipp Deputy [facility]  April 05, 2010 9:35 AM  pT RETURNED CALL. (SAME #) Tivis Ringer, CNA  April 05, 2010 10:22 AM  lmtcb  Philipp Deputy [facility]  April 05, 2010 10:42 AM   pt last saw dr young in 1999, pt is losing insurance the end of this week and needs to see cdy before then because he is not sure how long he will be w/o ins.--scheduled pt to see dr young wed at 11:15  per [facility] Follow-up by: Philipp Deputy CMA,  April 05, 2010 10:55 AM

## 2010-09-09 NOTE — Assessment & Plan Note (Signed)
Summary: rov lipid - lmc   Referring Provider:  Charlies Constable Primary Provider:  [facility]  CC:  dyslipidemia follow-up.  History of Present Illness:  Lipid Clinic Visit      The patient comes in today for dyslipidemia follow-up.  The patient has no complaints of medication problems, chest pain, shortness of breath, muscle aches, and muscle cramps.  Dietary compliance review reveals an overall grade of eating 5 or more fruits and vegetables and limiting fats and TFA's.  Review of exercise habits reveals that the patient is not exercising.  Compliance with medication is good.    55 -year old patient with history of CAD s/p bare-metal stenting to LAD and RCA in 2002.  This pleasant gentleman remains compliant with his medications, exercise regimen (2 - 3 days / week), and diet therapy.  Drug therapy includes triple therapy for focused lipid-lowering therapy.  The patient denies complications related to these medications at this time.     Current Medications (verified): 1)  Niaspan 1000 Mg Cr-Tabs (Niacin (Antihyperlipidemic)) .... Take 2 Tablet By Mouth At Bedtime 2)  Nitroglycerin 0.4 Mg Subl (Nitroglycerin) 3)  Pantoprazole Sodium 40 Mg Tbec (Pantoprazole Sodium) .Marland Kitchen.. 1 Tab Once Daily 4)  Lipitor 40 Mg Tabs (Atorvastatin Calcium) .... Take One Tablet By Mouth Daily. 5)  Aspirin Ec 325 Mg Tbec (Aspirin) .... Take One Tablet By Mouth Daily 6)  Ramipril 10 Mg Caps (Ramipril) .... Take One Capsule By Mouth Daily 7)  Fexofenadine Hcl 180 Mg Tabs (Fexofenadine Hcl) .Marland Kitchen.. 1 Once Daily 8)  Uroxatral 10 Mg Tb24 (Alfuzosin Hcl) .Marland Kitchen.. 1 By Mouth Daily 9)  Allopurinol 100 Mg Tabs (Allopurinol) .Marland Kitchen.. 1 By Mouth Daily 10)  Fibercon 625 Mg Tabs (Calcium Polycarbophil) .Marland Kitchen.. 1 - 2 Tabs Daily 11)  Vitamin C Cr 1000 Mg Cr-Tabs (Ascorbic Acid) .... 1.5 Tabs Daily 12)  Docusate Calcium 240 Mg Caps (Docusate Calcium) .Marland Kitchen.. 1 Daily 13)  Glucosamine 1500 Complex  Caps (Glucosamine-Chondroit-Vit C-Mn) 14)   Zetia 10 Mg Tabs (Ezetimibe) .... Take One Tablet By Mouth Daily.  Allergies: 1)  Sulfamethoxazole (Sulfamethoxazole)   Vital Signs:  Patient profile:   55 year old male Height:      70 inches Weight:      246 pounds Pulse rate:   80 / minute Pulse rhythm:   regular BP sitting:   120 / 78  (right arm) Cuff size:   large  Impression & Recommendations:  Problem # 1:  HYPERLIPIDEMIA-MIXED (ICD-272.4)  His updated medication list for this problem includes:    Niaspan 1000 Mg Cr-tabs (Niacin (antihyperlipidemic)) .Marland Kitchen... Take 2 tablet by mouth at bedtime    Lipitor 40 Mg Tabs (Atorvastatin calcium) .Marland Kitchen... Take one tablet by mouth daily.    Zetia 10 Mg Tabs (Ezetimibe) .Marland Kitchen... Take one tablet by mouth daily.  Returns to [facility] with no complaints and states compliance with meds.  He has been non-compliant with exercise - states he has been busy and fell off the helathy lifestyle band wagon.  He also states that he needs a TKR and will schedule this for May.  I have encouraged to restart low fat diet and exercise in addition to restarting Zetia to decrease LDL back to < 70   99 off Zetia - although I explained that meds can not replace the importance of healthy lifestyle!!!  f/u 6 months

## 2010-09-09 NOTE — Progress Notes (Signed)
Summary: refill request  Phone Note Refill Request Message from:  Pharmacy on June 29, 2010 12:30 PM  Refills Requested: Medication #1:  PANTOPRAZOLE SODIUM 40 MG TBEC 1 tab once daily [facility]   Method Requested: Electronic Initial call taken by: Glynda Jaeger,  June 29, 2010 12:30 PM Caller: [facility]    Prescriptions: PANTOPRAZOLE SODIUM 40 MG TBEC (PANTOPRAZOLE SODIUM) 1 tab once daily  #90 x 3   Entered by:   Kem Parkinson   Authorized by:   Lenoria Farrier, MD, [facility]   Signed by:   Kem Parkinson on 06/29/2010   Method used:   Electronically to        [facility] (mail-order)             , [place]         Ph: [phone]       Fax: [phone]   RxID:   [id number]

## 2010-09-10 NOTE — Letter (Signed)
Summary: [facility]  [facility]   Imported By: Lester  05/04/2010 08:16:51  _____________________________________________________________________  External Attachment:    Type:   Image     Comment:   External Document

## 2010-10-11 ENCOUNTER — Other Ambulatory Visit: Payer: Self-pay

## 2010-10-14 ENCOUNTER — Ambulatory Visit: Payer: Self-pay

## 2010-10-24 LAB — CBC
Hemoglobin: 10.9 g/dL — ABNORMAL LOW (ref 13.0–17.0)
Hemoglobin: 9.8 g/dL — ABNORMAL LOW (ref 13.0–17.0)
Hemoglobin: 12.6 g/dL — ABNORMAL LOW (ref 13.0–17.0)
MCH: 31.6 pg (ref 26.0–34.0)
MCH: 32 pg (ref 26.0–34.0)
MCHC: 34.1 g/dL (ref 30.0–36.0)
MCHC: 34.8 g/dL (ref 30.0–36.0)
Platelets: 184 10*3/uL (ref 150–400)
Platelets: 265 10*3/uL (ref 150–400)
RBC: 3.94 MIL/uL — ABNORMAL LOW (ref 4.22–5.81)
RDW: 16.4 % — ABNORMAL HIGH (ref 11.5–15.5)

## 2010-10-24 LAB — PROTIME-INR
INR: 1.15 (ref 0.00–1.49)
INR: 1.42 (ref 0.00–1.49)
INR: 1.12 (ref 0.00–1.49)
Prothrombin Time: 17.2 seconds — ABNORMAL HIGH (ref 11.6–15.2)
Prothrombin Time: 14.3 seconds (ref 11.6–15.2)

## 2010-10-24 LAB — BASIC METABOLIC PANEL
BUN: 5 mg/dL — ABNORMAL LOW (ref 6–23)
BUN: 5 mg/dL — ABNORMAL LOW (ref 6–23)
BUN: 10 mg/dL (ref 6–23)
CO2: 32 mEq/L (ref 19–32)
CO2: 29 mEq/L (ref 19–32)
Calcium: 8.9 mg/dL (ref 8.4–10.5)
Chloride: 98 mEq/L (ref 96–112)
Chloride: 109 mEq/L (ref 96–112)
Creatinine, Ser: 1.06 mg/dL (ref 0.4–1.5)
GFR calc Af Amer: >60 mL/min (ref >60)
GFR calc Af Amer: >60 mL/min (ref >60)
GFR calc non Af Amer: >60 mL/min (ref >60)
Potassium: 3.9 mEq/L (ref 3.5–5.1)
Potassium: 4.1 mEq/L (ref 3.5–5.1)
Potassium: 4.7 mEq/L (ref 3.5–5.1)
Sodium: 138 mEq/L (ref 135–145)

## 2010-10-24 LAB — GLUCOSE, CAPILLARY

## 2010-10-27 LAB — CBC
HCT: 38 % — ABNORMAL LOW (ref 39.0–52.0)
MCHC: 35.3 g/dL (ref 30.0–36.0)
MCV: 96.3 fL (ref 78.0–100.0)
Platelets: 240 10*3/uL (ref 150–400)
RDW: 12.8 % (ref 11.5–15.5)
WBC: 5.9 10*3/uL (ref 4.0–10.5)

## 2010-10-27 LAB — BASIC METABOLIC PANEL: Sodium: 138 mEq/L (ref 135–145)

## 2010-10-29 LAB — CBC
Hemoglobin: 10.6 g/dL — ABNORMAL LOW (ref 13.0–17.0)
MCHC: 35.3 g/dL (ref 30.0–36.0)
MCV: 96.5 fL (ref 78.0–100.0)
Platelets: 179 10*3/uL (ref 150–400)
Platelets: 188 10*3/uL (ref 150–400)
RBC: 2.56 MIL/uL — ABNORMAL LOW (ref 4.22–5.81)
RDW: 13.4 % (ref 11.5–15.5)
WBC: 8 10*3/uL (ref 4.0–10.5)
WBC: 8.7 10*3/uL (ref 4.0–10.5)

## 2010-10-29 LAB — BASIC METABOLIC PANEL
BUN: 7 mg/dL (ref 6–23)
CO2: 31 mEq/L (ref 19–32)
Calcium: 8.5 mg/dL (ref 8.4–10.5)
Chloride: 102 mEq/L (ref 96–112)
Creatinine, Ser: 0.83 mg/dL (ref 0.4–1.5)
Creatinine, Ser: 0.94 mg/dL (ref 0.4–1.5)
GFR calc Af Amer: >60 mL/min (ref >60)
GFR calc Af Amer: >60 mL/min (ref >60)
GFR calc non Af Amer: >60 mL/min (ref >60)
GFR calc non Af Amer: >60 mL/min (ref >60)
Potassium: 3.7 mEq/L (ref 3.5–5.1)

## 2010-10-29 LAB — PROTIME-INR
INR: 1.13 (ref 0.00–1.49)
INR: 1.2 (ref 0.00–1.49)
Prothrombin Time: 15.1 seconds (ref 11.6–15.2)
Prothrombin Time: 19.2 seconds — ABNORMAL HIGH (ref 11.6–15.2)

## 2010-12-21 NOTE — Assessment & Plan Note (Signed)
[facility]                               LIPID CLINIC NOTE   CRESPIN, FORSTROM                        MRN:          [id number]  DATE:12/06/2007                            DOB:          June 24, 1956    REASON FOR VISIT:  Return office visit for lipid clinic.   PAST MEDICAL HISTORY:  1. Hyperlipidemia.  2. Coronary artery disease status post bare metal stent to the LAD and      RCA in 2002.  3. Hypertension.  4. Gout.   MEDICATIONS:  1. Aspirin 325 mg daily.  2. Niaspan 2000 mg daily.  3. Altace 10 mg daily.  4. Allegra 180 mg daily.  5. Protonix 40 mg daily.  6. FiberCon 1000 mg daily.  7. Zetia 10 mg daily.  8. Uroxatral 10 mg daily.  9. Allopurinol 100 mg daily.  10.Lipitor 40 mg daily.  11.Vitamin C daily.  12.Surfak 1 gram daily.  13.Glucosamine and chondroitin daily.   PHYSICAL EXAMINATION:  VITAL SIGNS:  Weight 240 pounds, blood pressure  110/70, heart rate.   LABORATORY DATA:  Total cholesterol 100, triglyceride 59, HDL 38, LDL  57.  LFTs within normal limits.   ASSESSMENT:  Mr. Tutson is a very pleasant 55 year old gentleman who  returns to the lipid clinic today with no chest pain, shortness of  breath and no muscle aches or pains.  He is compliant with current  medication regimen.  He has total cholesterol at goal of less than 200,  triglycerides at goal less than 150, HDL less than goal of greater than  40, and LDL at goal of less than 70.  His HDL has dropped from 43 to 38  over the last several months simply because of a knee issue.  He had  arthroscopic surgery of his knee which since then has been giving him  lots of pain.  He is now having some type of injections in his knee and  will possibly have to go for a knee replacement in the near future.  He  is unable to do any exercise aside from this activities of daily living.  Even walking on a daily basis has given him tremendous pain.  Therefore,  his HDL will  continue drop, and he has continued to gain weight since he  is not able to do the exercise, but he says that he would like to and he  does plan on restarting as soon as his knee injury is improved.  He does  continue to eat a low-fat, low-carbohydrate evening meal.  He eats a  fairly low-fat healthy lunch, and his breakfast seems to be probably the  most unhealthy meal of a Nutri-Grain bar or some type of fruit strudel  in the morning.  Other than that, he does occasionally have some type of  sweet snack, but that is on a rare occasion.  He is willing to decrease  portion sizes and increase the fruits and vegetables, and we have talked  about this at subsequent visits as well.  He does this for a few  days  after a visit and then continues to go back to his normal eating habits.  We will continue to address diet and portion sizes, especially while he  is unable to do any exercise.  However, his lipid panel is at goal.   PLAN:  1. Continue current medications.  2. Continue to encourage low-fat, low-carbohydrate diet and low      portion sizes.  3. Continue to encourage exercise once knee is better.  4. Followup visit in 6 months for lipid panel and LFTs, and we will      make changes at that time.      Leota Sauers, PharmD  Electronically Signed      Jesse Sans. Daleen Squibb, MD, [facility]  Electronically Signed   LC/MedQ  DD: 12/06/2007  DT: 12/06/2007  Job #: [id number]

## 2010-12-21 NOTE — Assessment & Plan Note (Signed)
[facility]                               LIPID CLINIC NOTE   QUILLAN, [name] [name]                        MRN:          [id number]  DATE:12/14/2006                            DOB:          1955-11-21    The patient seen back in the lipid clinic for further evaluation and  medication titration.  He states he has just hyperlipidemia. He has been  feeling and doing well overall. He continues to use chicken and fish  regularly. He eats red meat infrequently and only cooks with olive oil.  He sporadically has an alcoholic beverage at night. He does not use  tobacco. He is walking 1-2 miles 3-4 times each week. He has not been  doing this as much though because he has been repairing some cars.   PAST MEDICAL HISTORY:  Pertinent for documented coronary disease.   CURRENT MEDICATIONS:  Updated on the chart. Lipid-lowering therapy  includes:  1. Lipitor 40 mg daily.  2. Niaspan 2 grams daily at bedtime.  3. Zetia 10 mg daily.   PHYSICAL EXAMINATION:  VITAL SIGNS:  Weight is 235 pounds, blood  pressure is 120/82, heart rate is 68.   LABORATORY DATA:  Labs from May 5 reveal total cholesterol 97,  triglyceride 57, HDL 37.4, LDL 48. LFTs are within normal limits except  alkaline phosphatase at 119. Total bilirubin 1.4, ALT 45 which is  unchanged from his last lab panel.   ASSESSMENT:  The patient has documented coronary disease and LDL goal of  less than 70, which he meets, and an HDL goal of less than 100, which he  meets and HDL goal greater than 40 which is improving. The patient will  continue to select lean meats and will continue to exercise and will  continue his drug therapy. He will questions or problems in the meantime  and followup in the office on November 13 at 4:00.      Shelby Dubin, PharmD, BCPS, CPP  Electronically Signed      Rollene Rotunda, MD, [facility]  Electronically Signed   MP/MedQ  DD: 12/29/2006  DT: 12/29/2006  Job #:  [id number]   cc:   Everardo Beals. Juanda Chance, MD, [facility]

## 2010-12-21 NOTE — Assessment & Plan Note (Signed)
[facility]                            CARDIOLOGY OFFICE NOTE   ALIX, STOWERS                        MRN:          [id number]  DATE:07/10/2008                            DOB:          1955-08-20    PRIMARY CARE PHYSICIAN:  [facility].   CLINICAL HISTORY:  Mr. Proch is 55 years old and returned for  followup management of his coronary heart disease.  He had bare-metal  stents placed in the LAD and right coronary artery in 2002 by Dr.  Gerri Spore.  His last cath was in 2007, at which time he had  nonobstructive disease.  He has done quite well over the past year and  has had no chest pain, shortness of breath, or palpitations.   PAST MEDICAL HISTORY:  Significant for hypertension and hyperlipidemia.  He has had arthroscopic surgery on his knee in 2008.   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  He does not smoke.   CURRENT MEDICATIONS:  1. Aspirin 325 mg daily.  2. Niaspan 2 g daily.  3. Altace 10 mg daily.  4. Allegra.  5. Protonix.  6. FiberCon.  7. Zetia 10 mg daily.  8. Uroxatral.  9. Allopurinol.  10.Lipitor 40 mg daily.  11.Vitamin C.  12.Surfak.  13.Glucosamine.   PHYSICAL EXAMINATION:  Blood pressure was 136/87, the pulse 85 and  regular.  There was no venous distension.  The carotid pulses were full  without bruits.  Chest was clear.  Cardiac rhythm was regular.  He had  no murmurs or gallops.  The abdomen was soft with normal bowel sounds.  There was no hepatosplenomegaly.  Peripheral pulses were full with no  peripheral edema.   Electrocardiogram showed borderline right axis deviation and nonspecific  ST-T changes.   IMPRESSION:  1. Coronary artery disease status post percutaneous coronary      intervention of the left anterior descending and right coronary      artery with bare-metal stents in 2002 with nonobstructive disease      at last  catheterization in 2007.  2. Hypertension.  3. Hyperlipidemia.  4. Degenerative joint disease.  5. Excess weight.   RECOMMENDATIONS:  I think, Mr. Bertsch is doing well.  His liver  profile is good except his HDL is borderline at 37.  I encouraged him to  work on his weight reduction and he has had an intermediate goal of 220  for his weight.  He states he is already restricting the bad carbs.  I  encouraged him to exercise on a more regular basis.  We will continue  his medicines as same, and we will see him back in followup in 1 year.     Bruce Elvera Lennox Juanda Chance, MD, [facility]  Electronically Signed    BRB/MedQ  DD: 07/10/2008  DT: 07/10/2008  Job #: [id number]

## 2010-12-21 NOTE — Assessment & Plan Note (Signed)
[facility]                            CARDIOLOGY OFFICE NOTE   JARREL, KNOKE                        MRN:          [id number]  DATE:07/17/2007                            DOB:          [birth year]    PRIMARY CARE PHYSICIAN:  [facility]   CLINICAL HISTORY:  Mr. Balthazor is 55 years old and has documented  coronary disease.  In 2002, he had bare metal stents placed in the LAD  and right coronary by Dr. Gerri Spore.  He was admitted to the hospital in  2007 with chest pain and had non-obstructive disease at catheterization.  He had done quite well since that time.  He had had no chest pain,  shortness of breath or palpitations.  He had arthroscopic surgery on his  knee a few weeks ago, and so his activity level has been decreased and  he has gained a little bit of weight.   PAST MEDICAL HISTORY:  1. Hypertension.  2. Hyperlipidemia.   CURRENT MEDICATIONS:  1. Niaspan.  2. Zetia.  3. Lipitor.  4. Aspirin.  5. Altace.  6. Protonix.  7. Uroxatral.  8. Allopurinol.   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  He does some traveling.   PHYSICAL EXAMINATION:  VITAL SIGNS:  The blood pressure was 119/89,  pulse 75 and regular.  NECK:  There was no venous distention.  The carotid pulses were full  without bruits.  CHEST:  Clear.  HEART:  Cardiac rhythm was regular.  I could hear no murmurs or gallops.  ABDOMEN:  Soft with normal bowel sounds.  There was no  hepatosplenomegaly.  EXTREMITIES:  Peripheral pulses were full.  There was no peripheral  edema.   ELECTROCARDIOGRAM:  Normal.   IMPRESSION:  1. Coronary artery disease status post PCI of the left anterior      descending and right coronary artery in 2002 using bare metal      stents with non-obstructive disease at catheterization in 2007.  2. Hypertension.  3. Hyperlipidemia.  4. Degenerative joint  disease.   RECOMMENDATIONS:  I think Mr. Mccarley is doing well.  His risk profile  looks excellent.  His blood pressure is very good.  Some of the main  things he needs to work on are his weight and exercise, and he says he  will plan to do this when his knee gets better.  Will plan to see him  back in follow up in a year.     Bruce Elvera Lennox Juanda Chance, MD, [facility]  Electronically Signed    BRB/MedQ  DD: 07/17/2007  DT: 07/18/2007  Job #: [id number]

## 2010-12-21 NOTE — Assessment & Plan Note (Signed)
[facility]                               LIPID CLINIC NOTE   [name] [name], [name] [name]                        MRN:          [id number]  DATE:06/21/2007                            DOB:          26-Dec-1955    Return office visit for Lipid Clinic.   PAST MEDICAL HISTORY:  1. Hyperlipidemia.  2. Coronary artery disease.  3. Gastroesophageal reflux disease.  4. Status post PCI in the LAD and RCA.  5. Gout.   MEDICATIONS:  1. Aspirin 325 mg daily.  2. Niaspan 2000 mg daily.  3. Altace 10 mg daily.  4. Allegra 180 mg daily.  5. Protonix 40 mg daily.  6. FiberCon 1 gram daily.  7. Zetia 10 mg daily.  8. Uroxatral 10 mg daily.  9. Allopurinol 100 mg daily.  10.Lipitor 40 mg daily.  11.Vitamin C 1500 mg daily.   VITAL SIGNS:  Weight 241 pounds, blood pressure 138/80, heart rate 88.   LAB DATA:  Total cholesterol 102, triglyceride 49, HDL 44, LDL 49, total  bili 1.3, alkaline phosphatase 118, AST 41, ALT 62.   ASSESSMENT:  Mr. Jerolyn Center is a very pleasant soft-spoken 55 year old  gentleman who returns to Lipid Clinic today with no chest pain, no  shortness of breath, no muscle aches or pains.  His only complaint is  some pain in his knee which he just had arthroscopic surgery and removed  some cartilage.  He said that he has had limited mobility and pain on  walking for several months prior and therefore has had significant  weight gain.  He is allowed to restart his walking exercise program in  about 2 to 3 weeks after clearance from his surgeon.  He says that his  knee does feel a whole lot better and he is looking forward to  restarting his exercise regimen.  He follows a fairly low fat, low  carbohydrate diet, his portion sizes have increased with his inactivity  and he is willing to decrease portions, increase fruits and vegetables  and decrease some of the carbohydrates he has been eating.  He does not  snack all that often, but occasionally in the  evening after meals, while  watching TV, he does have ice cream, ice cream sandwiches, cakes  cookies, etc. that is in the house.  He will do a better job of limiting  his evening snacks.  His total cholesterol is at goal of less than 200,  triglycerides at goal of less than 150, HDL at goal of greater than 40,  LDL at goal of less than 70.  Slightly elevated but stable liver  function tests.   PLAN:  1. Continue current medication therapy.  2. Restart exercise regimen.  3. Decrease fats and carbohydrates in diet, eat well, increasing fresh      fruits and vegetables.  4. Followup visit in 6 months for lipid panel and LFTs and make      adjustments needed at that time.      Leota Sauers, PharmD  Electronically Signed      Jesse Sans.  Daleen Squibb, MD, [facility]  Electronically Signed   LC/MedQ  DD: 06/21/2007  DT: 06/22/2007  Job #: [id number]

## 2010-12-21 NOTE — Assessment & Plan Note (Signed)
[facility]                               LIPID CLINIC NOTE   [name] [name], [name] [name]                        MRN:          [id number]  DATE:06/05/2008                            DOB:          01/12/1956    PAST MEDICAL HISTORY:  1. Hyperlipidemia.  2. Coronary artery disease, status post bare-metal stent to his LAD      and his RCA in 2002.  3. Hypertension.  4. Gout.   MEDICATIONS:  1. Aspirin 325 mg daily.  2. Niaspan 2000 mg daily.  3. Altace 10 mg daily.  4. Allegra 180 mg daily.  5. Protonix 40 mg daily.  6. FiberCon 1 g daily.  7. Zetia 10 mg daily.  8. Allopurinol 100 mg daily.  9. Lipitor 40 mg daily.  10.Vitamin C daily.  11.Zyrtec daily.  12.Glucosamine chondroitin daily.  13.Aleve as needed for pain.   PHYSICAL EXAMINATION:  VITAL SIGNS:  Weight 240 pounds, blood pressure  128/68, and heart rate 80.   LABORATORY DATA:  Total cholesterol 104, triglyceride 47, HDL 38, and  LDL 57.  LFTs within normal limits.   ASSESSMENT:  [name] [name] is a very pleasant 55 year old gentleman who  returns to Lipid Clinic today with no chest pain, no shortness of  breath, no muscle aches or pains.  He states compliance with current  medication regimen.  His total cholesterol at goal of less than 200,  triglycerides at goal of less than 158, LDL at goal of less than 70, and  HDL is slightly less than goal of greater than 40.  He states that he  tries to exercise 3-4 days a week.  However, he usually only gets in  about 2 days a week of walking on a treadmill for 30-45 minutes.  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  He takes 2 Aleve and walks and then ices afterwards and does the best  that he can.  He does know that he needs a knee replacements in the  future, but it is just not ready to have that done at this moment.  We  had a lengthy conversation regarding the benefit of a cardiovascular  health benefit of  exercise.  He is willing to increase his exercise.  He  does understand that he has gained about 10 pounds in the last 18 months  and is willing to make changes in his lifestyle to decrease that weight  at least 10 pounds in the next 6-8 months.  He will strive for walking  on the treadmill 5 days a week.  However, realistically he understands  that he will probably not make more than 3-4 times a week.  He has  increased the amount of portions of his evening meal as well as the  amount of snacks and desserts that he has had in the evenings.  I have  encouraged him to split deserts with his wife or bring them home and  save them for another day to limit his desserts in the evening to max  3  days a week and to limit portion sizes of his evening meal.  He does  agreed that he has become a little slack with his low-fat, low-  carbohydrate diet that he had previously been doing very well on because  of the dizziness that his life has become especially with work.   PLAN:  1. Is to continue current medication regimen.  2. Is to increase exercise.  3. Decrease fats, carbohydrates, and portion sizes of diet.  4. Followup visit in 6 months for lipid panel and LFTs and make      adjustments at that time.      Leota Sauers, PharmD  Electronically Signed      Jesse Sans. Daleen Squibb, MD, [facility]  Electronically Signed   LC/MedQ  DD: 06/05/2008  DT: 06/06/2008  Job #: [id number]

## 2010-12-24 NOTE — H&P (Signed)
[name] [name], [name] [name]               ACCOUNT NO.:  [id number]   MEDICAL RECORD NO.:  [id number]          PATIENT TYPE:  EMS   LOCATION:  [facility]                         FACILITY:  [facility]   PHYSICIAN:  Thomas C. Wall, MD, FACCDATE OF BIRTH:  1955-12-13   DATE OF ADMISSION:  06/26/2006  DATE OF DISCHARGE:                                HISTORY & PHYSICAL   PRIMARY CARE PHYSICIAN:  Production assistant, radio.   PRIMARY CARDIOLOGIST:  Dr. Charlies Constable.   CHIEF COMPLAINT:  Chest pain.   HISTORY OF PRESENT ILLNESS:  Mr. Eskew is a 55 year old male with a  history of coronary artery disease.  He developed substernal chest pain that  he describes as a pressure at work at approximately 10 a.m.  His symptoms  did not improve in about 30 minutes, so he took nitroglycerin.  After the  second sublingual nitroglycerin, his pain was relieved and has not recurred.  It started with minimal activity.  He has had no recent clearly anginal  symptoms, but has had occasional brief nonexertional substernal chest pain.  These episodes have resolved spontaneously in a relatively brief period of  time.  Today is the first time recently that he has had symptoms, which  reminded him of his prepercutaneous intervention symptoms.  He states that  the patient reached a 5/10 and was not associated with shortness of breath,  nausea, vomiting, or diaphoresis.  He is currently pain free.  Recently he  has noticed increased dyspnea on exertion and decreased stamina.  He has not  been exercising recently.  He does occasionally exert himself at work and  was doing a household project yesterday that required him to be more active  than usual, but he did not have any chest pain until today.   PAST MEDICAL HISTORY:  1. Non-STEMI in 2002 and the REPLACE study with bare-metal stents placed      to the LAD and RCA.  2. Hypertension.  3. Hyperlipidemia.  4. Family history of premature coronary artery disease.  5. Obesity  with a body mass index of 31.9.  6. Seasonal allergies.  7. Status post cardiac catheterization in 2003 with no disease in the left      main, LAD 40% in-stent restenosis, circumflex okay, RCA 40% proximal,      10% in-stent restenosis, and an EF of 60%.  8. Gastroesophageal reflux disease.  9. Degenerative joint disease.  10.Gout.  11.BPH.   SURGICAL HISTORY:  Status post cardiac catheterization x3, three knee  surgeries on the right and one on the left, as well as bilateral shoulder  surgery.   ALLERGIES:  SULFA.   MEDICATIONS:  1. Lipitor 40 mg a day.  2. Niaspan 1500 mg q. day.  3. Altace 10 mg a day.  4. Allegra-D 180/240 q. day.  5. Protonix 40 mg a day.  6. Aspirin  325 mg a day.  7. __________ 1000 mg a day.  8. Surfak q. day.  9. Zetia 10 mg a day.  10.Uroxatral 10 mg q. day.  11.Allopurinol 100 mg q. day.   SOCIAL HISTORY:  Lives in [place] with his wife and works as a Higher education careers adviser.  He has between a 5 and 10 pack year history of tobacco use, but  quit 17 years ago.  He does not abuse alcohol or drugs.   FAMILY HISTORY:  His mother died at age 63, but had had heart disease since  her 37s.  His father died of cancer with no history of heart disease, but  one brother had bypass surgery at age 31.   REVIEW OF SYSTEMS:  He has not had any recent fevers, chills, sweats, or  other illnesses.  The chest pain is described above.  He has noticed  increased dyspnea on exertion and decreased stamina recently.  He denies  claudication symptoms, presyncope, cough, or wheezing.  His urinary problems  are controlled by the Uroxatral.  He denies depression and anxiety.  He has  chronic arthralgias, but does not take NSAIDs regularly.  He has noticed  increased reflux symptoms recently.  Review of systems is otherwise  negative.   PHYSICAL EXAMINATION:  VITAL SIGNS:  Temperature is 97.6, blood pressure  103/55, pulse 84, respiratory rate 16, O2 saturation 97% on 2  liters.  GENERAL:  He is a well-developed, well-nourished white male in no acute  distress.  HEENT:  His head is normocephalic and atraumatic with pupils equal, round,  reactive to light and accommodation.  Extraocular movements intact.  Sclerae  clear.  Nares without discharge.  NECK:  There is no lymphadenopathy, thyromegaly, bruit, or JVD noted.  CARDIOVASCULAR:  His heart is regular in rate and rhythm with an S1 and S2  and no significant murmur, rub, or gallop is noted.  LUNGS:  Clear to auscultation bilaterally.  SKIN:  No rashes or lesions are noted.  ABDOMEN:  Soft and nontender with active bowel sounds and no  hepatosplenomegaly is noted by palpation.  EXTREMITIES:  There is no cyanosis, clubbing, or edema noted.  MUSCULOSKELETAL:  There is no joint deformity or lesions and no spine or CVA  tenderness is noted.  NEUROLOGICAL:  He is alert and oriented.  Cranial nerves II-XII grossly  intact.   Chest x-ray is pending.   EKG is sinus rhythm, rate 88, with no acute ischemic changes and no  significant change from an EKG dated 2003.   LABORATORY VALUES:  Hemoglobin 13.7, hematocrit 39.3, WBCs 6.6, platelets  247, sodium 136, potassium 4.1, chloride 106, CO2 23, BUN 11, creatinine  0.8, glucose 113.  Point of care markers negative x2.   IMPRESSION:  Chest pain:  The patient states that his symptoms are  reminiscent of the prepercutaneous intervention pain in 2002.  He is also  concerning for progression of coronary artery disease with a decrease in his  stamina and an increase in his recent dyspnea on exertion.  He will be  admitted and ruled out for myocardial infarction.  He will be placed on  heparin and IV nitroglycerin.  He is scheduled for cardiac catheterization  tomorrow,  but is an add-on.  If his cardiac enzymes are positive, Integrilin will be  added.  He will be continued on his current medications, with the exception of changing the Allegra-D, which contains  pseudoephedrine to Allegra 180 mg  a day.  We will continue his lipid-lowering medications, as he is followed  closely by the lipid clinic at [facility].      Theodore Demark, PA-C      Jesse Sans. Daleen Squibb, MD, [facility]  Electronically Signed  RB/MEDQ  D:  06/26/2006  T:  06/27/2006  Job:  [id number]

## 2010-12-24 NOTE — Assessment & Plan Note (Signed)
[facility]                               LIPID CLINIC NOTE   [name] [name], [name] [name]                        MRN:          [id number]  DATE:08/31/2006                            DOB:          1956/06/29    PRIMARY CARE PHYSICIAN:  [facility].   REQUESTING PHYSICIAN:  Bruce R. Juanda Chance, MD, [facility]   Patient is seen back in the lipid clinic for further evaluation and  medication titration.  __________ of his documented coronary disease.  He has a history of PCI, status post stenting placed in the LAD and RCA  in 2002 by Dr. Loraine Leriche Pulsipher.  He had nonobstructive disease on a more  recent cath, completed in November, 2007.  He has been feeling and doing  well overall.  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  Other risk factors  include hypertension and hyperlipidemia.   CURRENT MEDICATIONS:  1. Aspirin 325 mg daily.  2. Niaspan 2 gm daily at bedtime.  3. Altace 10 mg daily.  4. Allegra 180 mg daily.  5. Protonix 40 mg daily.  6. Fibercon 1 gm daily.  7. Zetia 10 mg daily.  8. UroXatral 10 mg daily.  9. Allopurinol 100 mg daily.  10.Lipitor 40 mg daily.   REVIEW OF SYSTEMS:  As stated in the HPI and otherwise negative.   PHYSICAL EXAMINATION:  VITAL SIGNS:  Weight today in the office 240  pounds.  Blood pressure is 124/70.  Heart rate is 76.  Respirations 16.   Labs have been put in the chart.   ASSESSMENT:  Patient has documented coronary artery disease and risk  factors.  The LDL goal less than 79.  HDL goal less than 100.  HDL goal  greater than 40.  Patient is seen in the  __________.  Patient will continue on his therapies for now.  He will  follow up in six months, sooner with questions or problems in the  meantime.      Shelby Dubin, PharmD, BCPS, CPP  Electronically Signed      Rollene Rotunda, MD, [facility]  Electronically Signed   MP/MedQ  DD: 09/07/2006   DT: 09/07/2006  Job #: [id number]   cc:   [facility]  Everardo Beals. Juanda Chance, MD, [facility]

## 2010-12-24 NOTE — Discharge Summary (Signed)
NAMETRINO, HIGINBOTHAM                           ACCOUNT NO.:  [id number]   MEDICAL RECORD NO.:  [id number]                   PATIENT TYPE:  INP   LOCATION:  2021                                 FACILITY:  [facility]   PHYSICIAN:  Pricilla Riffle, M.D. [facility]             DATE OF BIRTH:  [birth year]   DATE OF ADMISSION:  03/25/2002  DATE OF DISCHARGE:                                 DISCHARGE SUMMARY   HISTORY OF PRESENT ILLNESS:  The patient is a 55 year old white male who is  well known to our practice. He called our office on March 25, 2002,  complaining of chest discomfort and he was told to come to the emergency  room. The discomfort that he experienced started in his chest and radiated  to his left arm, and he describes this  as a weakness associated with  diaphoresis. He states that he has been having good exercise tolerance, but  he has been having  extreme shortness of breath with strenuous activity. His  history is notable for unstable angina in July of 2002 and he received a  stent to the RCA reducing this from  80% to 0% and a 99% LAD lesion also to  0%. He underwent a Cardiolite on March 19, 2002, which showed an EF of 53%  and trivial apical ischemia. He also has a history of hypertension,  hyperlipidemia, GERD and remote tobacco use. Admission PT was 28, PTT 13.6,  sodium 137, potassium 4.0, BUN 11, creatinine 1.0, SGOT and SGPT were  slightly elevated at 40 and 45, H&H 13.4 and 38.7, normal indices, platelets  272, WB Cs 5.8.  An EKG showed normal sinus rhythm early R-waves.   HOSPITAL COURSE:  Dr. Lucina Mellow  saw the patient and felt that with his chest  should undergo cardiac catheterization. On March 26, 2002, Dr. Juanda Chance  performed cardiac catheterization. According to his progress note he had a  40% in-stent restenosis of the mid  LAD. He had a 40% proximal RCA, a less  than  10% in-stent restenosis of the  distal RCA and a 40% PDA. His EF was  60% without wall motion  abnormalities. Dr. Juanda Chance noted that there was no  source of ischemia, that he would  review. Dr. Juanda Chance spoke with the  patient's family and wife, and he noted their concern about the symptoms of  short of breath with severe exertion, as well as associated hypertension  with exercise. Dr. Juanda Chance states that he will plan for an outpatient echo  and an outpatient stress test with maximum exercise to look for blood  pressure changes.   DISCHARGE DIAGNOSES:  Noncardiac chest discomfort, catheterization showed  coronary artery disease which was previously described.   DISPOSITION:  He was discharged to home.   DISCHARGE MEDICATIONS:  He was asked to continue his home medications.  These include:  1. Lipitor 40 mg q.h.s.  2.  __________  1500 q.d.  3. Protonix 40 mg q.o.d.  4. Altace 10 q.d.  5. Coated aspirin 325 q.d.  6. Flomax q.d.  7. Fibercon q.d.  8. Allegra 180 b.i.d.  9. Surfak q.d.  10.      Sublingual nitroglycerin p.r.n.   DISCHARGE INSTRUCTIONS:  No lifting,driving, sexual activity or heavy  exertion for two days. Maintain a low salt, low cholesterol diet. If he has  any problems with his catheterization site he was asked to call us  immediately.   FOLLOW UP:  He will have an echocardiogram on Thursday, April 04, 2002, at  8:30  a.m. He will also see Dr. Charlton Haws. on Thursday, April 04, 2002,  at 9:30 a.m. for a groin check and the office will call him after they have  discussed with Dr. Juanda Chance the plans for scheduling a stress test with Dr.  Regino Schultze supervision.     Elson Areas, M.D. [facility]    EW/MEDQ  D:  03/26/2002  T:  03/28/2002  Job:  [id number]   cc:   Everardo Beals. Juanda Chance, M.D. [facility]  [address]  [place]  [place] [address]   Teena Irani. Arlyce Dice, M.D.

## 2010-12-24 NOTE — Assessment & Plan Note (Signed)
[facility]                            CARDIOLOGY OFFICE NOTE   SUVAN, STCYR                        MRN:          [id number]  DATE:07/13/2006                            DOB:          07-Aug-1956    PRIMARY CARE PHYSICIAN:  [facility].   PAST MEDICAL HISTORY:  Mr. Renwick is 55 years old and was recently  hospitalized for chest pain.  He developed unstable angina in 2002 and  had a stent placed to the LAD and right coronary artery by Dr.  Gerri Spore.  He was recently admitted to the hospital with chest pain and  underwent catheterization by Dr. Gala Romney, had nonobstructive disease.  He has done well since then.  He has had no recurrence of chest pain,  shortness of breath, or palpitations.   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   His past medical history is significant for hypertension and  hyperlipidemia.   CURRENT MEDICATIONS:  Include aspirin, Niaspan, Zetia, Lipitor, Altace,  Allegra, Protonix , Fibercon, UroXatral, and allopurinol.   EXAM:  Blood pressure is 114/76 and the pulse 95 and regular.  There was no venous distension.  The carotid pulses were full without  bruits.  CHEST:  Clear.  CARDIAC:  Rhythm was regular.  I could hear no murmurs or gallops.  ABDOMEN:  Soft without organomegaly.  Peripheral pulses were full and there was no peripheral edema.   An electrocardiogram showed nonspecific ST-T changes.   IMPRESSION:  1. Coronary artery disease status post placement of stents in the left      anterior descending and right coronary artery in 2002.  2. Recent episode of chest pain with nonobstructive disease at      catheterization.  3. Hypertension.  4. Hyperlipidemia.   RECOMMENDATIONS:  I think Mr. Sigmund is doing well.  His last lipid  profile in July actually was quite good, except his HDL was slightly  low.  I encouraged him to lose weight and  follow  diet low in bad carbs.  We will not make any changes in his medications  and plan to see him back in followup in a year.     Bruce Elvera Lennox Juanda Chance, MD, [facility]  Electronically Signed    BRB/MedQ  DD: 07/13/2006  DT: 07/13/2006  Job #: [id number]   cc:   [facility]

## 2010-12-24 NOTE — Cardiovascular Report (Signed)
[facility]  Patient:    [name] [name], [name] [name]                        MRN: [id number] Proc. Date: 03/06/01 Adm. Date:  [id number] Attending:  Daisey Must CC:         Teena Irani. Arlyce Dice, M.D.  Cardiac Catheterization Laboratory   Cardiac Catheterization  PROCEDURES PERFORMED: 1. Percutaneous transluminal coronary angioplasty with stent placement    in the mid left anterior descending artery. 2. Percutaneous transluminal coronary angioplasty with stent placement in the    distal right coronary artery.  INDICATIONS:  The patient is a 55 year old male with multiple cardiovascular risk factors, who presented with unstable angina.  Cardiac catheterization done earlier today by Dr. Diona Browner revealed the presence of a 99% stenosis in the mid LAD and an 80% stenosis in the distal right coronary artery.  After review of the images, we opted to proceed with percutaneous coronary intervention.  DESCRIPTION OF PROCEDURE:  A preexisting 6 French sheath in the right femoral artery was exchanged over a wire for a 7 [place] sheath.  The patient was enrolled in the REPLACE study and treated per study protocol.  We initially treated the LAD.  We used a 7 [place] guiding catheter and a BMW wire.  The lesion was pre-dilated with a 2.5 x 15 mm Maverick balloon inflated to 6 atmospheres.  We then deployed at 2.5 x 15 mm NIR stent at 7 atmospheres. This stent was then post-dilated with a 2.5 x 15 mm Quantum balloon inflated to 15 atmospheres.  We then utilized a 2.75 x 9 mm Quantum balloon in the proximal aspect of the stent and inflated this to 20 atmospheres.  Final angiographic  images revealed patency of the LAD with 0% residual stenosis and TIMI-3 flow.  We then turned our attention to the right coronary artery.  We used a 7 [place] guiding catheter with side holes and a BMW wire.  The lesion was pre-dilated with a 2.5 x 15 mm Maverick inflated 15 atmospheres.   We then deployed a 3.5 x 15 NIR stent at a deployment pressure of 8 atmospheres.  This stent was post-dilated with a 3.5 x 15 mm Quantum balloon and inflated to 14 atmospheres.  Final angiographic images revealed patency of the right coronary artery with 0% residual stenosis and TIMI-3 flow.  COMPLICATIONS:  None.  RESULTS: 1. Successful percutaneous transluminal coronary angioplasty with stent    placement in the mid left anterior descending artery reducing a 99%    stenosis to 0% residual with TIMI-3 flow. 2. Successful percutaneous transluminal coronary angioplasty with stent    placement in the distal right coronary artery reducing an 80% stenosis    to 0% residual with TIMI-3 flow.  PLAN:  The patient will be treated for study protocol.  Plavix will be administered for four weeks. DD:  03/06/01 TD:  03/06/01 Job: [id number] ON/GE952

## 2010-12-24 NOTE — Discharge Summary (Signed)
NAMEKELVON, [name] [name]               ACCOUNT NO.:  [id number]   MEDICAL RECORD NO.:  [id number]          PATIENT TYPE:  INP   LOCATION:  6533                         FACILITY:  [facility]   PHYSICIAN:  Bevelyn Buckles. Bensimhon, MDDATE OF BIRTH:  1956/01/21   DATE OF ADMISSION:  06/26/2006  DATE OF DISCHARGE:  06/27/2006                           DISCHARGE SUMMARY - REFERRING   DISCHARGE DIAGNOSES:  1. Chest discomfort of uncertain etiology.  2. Coronary artery disease with patent stents on cardiac catheterization.      History as noted below.   PROCEDURES PERFORMED:  Cardiac catheterization by Dr. Nicholes Mango on  June 27, 2006.   BRIEF HISTORY:  [name] [name] is a 55 year old white male who developed  substernal chest pressure while working at approximately 10:00 a.m. on the  day of admission.  After taking 2 sublingual nitroglycerins, this relieved  discomfort.  This discomfort actually started with minimal activity.  He has  not had any recent anginal symptoms but occasionally notices nonexertional  chest discomfort that remind him of his pre-PCI symptoms.  He did not have  any associated shortness of breath, nausea, vomiting or diaphoresis.  He  gave the discomfort a 5 on a scale of 0 to 10.  He has not been exercising  recently.   PAST MEDICAL HISTORY:  Past medical history is notable for myocardial  infarction in 2002 for which was placed in the REPLACE Study with a bare-  metal stent to the LAD and RCA.  He has a history of hypertension,  hyperlipidemia, early family history, obesity with a BMI of 31.9.  Last  catheterization was in August 2003 which showed an EF of 60%, 40% in-stent  restenosis in the LAD, 40% RCA, 10% in-stent restenosis of the RCA.  He also  has a history of GERD, remote tobacco use.   LABORATORY DATA:  Chest x-ray on the 19th did not show any active disease.  EKGs on admission showed normal sinus rhythm, normal axis, early R-wave,  nonspecific ST-T wave  changes.  Admission H&H was 13.7 and 39.3, normal  indices, platelets 247, WBCs 6.6.  PTT 30, PT 13.9.  Sodium 136, potassium  4.1, BUN 11, creatinine 0.8, glucose 113, normal LFTs.  Hemoglobin A1c was  5.6.  CK-MBs, relative indexes and troponins were unremarkable for  myocardial infarction.  TSH was 1.901.   HOSPITAL COURSE:  [name] [name] was admitted to 6500, continued on his home  medications and placed on IV heparin.  Respiratory assisted with CPAP.  Overnight, he did not have any further chest discomfort, and he had ruled  out for myocardial infarction.  Cardiac catheterization was performed on  June 27, 2006 by Dr. Gala Romney.  This revealed a 20% proximal RCA  irregularity in the distal RCA stent, 50% PDA lesion, 30% proximal lesion at  the LAD just prior to the LAD stent.  Renal arteries were patent  bilaterally.  Dr. Gala Romney did not feel his chest discomfort was cardiac  related.  EF was 60%.  Post bed rest, he was ambulating without difficulty.  Catheterization site was intact.  Thus, it  was felt that he could be  discharged home.   DISPOSITION:  The patient is discharged home, asked to maintain low salt,  fat and cholesterol diet.  Activity and wound care are restricted per  supplemental discharge sheet.  He was asked to bring all medications to all  appointments.   His home medications remain unchanged.  These include:  1. Aspirin 325 daily.  2. Niaspan as previously as the dose is uncertain.  3. Altace 10 mg daily.  4. Allegra 180 mg daily.  5. Protonix 40 mg daily.  6. FiberCon 1000 mg daily.  7. Zetia 10 mg daily.  8. Uroxatrol 10 mg daily.  9. Allopurinol 100 mg daily.  10.Lipitor 40 mg q.h.s.  11.Nitroglycerin 0.4 as needed.   He will follow up with Dr. Juanda Chance on July 17, 2006 at 3:15 p.m.   Discharge time less than 30 minutes.      Joellyn Rued, PA-C      Bevelyn Buckles. Bensimhon, MD  Electronically Signed    EW/MEDQ  D:  06/27/2006  T:   06/27/2006  Job:  [id number]   cc:   [facility]  Everardo Beals. Juanda Chance, MD, [facility]

## 2010-12-24 NOTE — Assessment & Plan Note (Signed)
[facility]                              CARDIOLOGY OFFICE NOTE   [name] [name], [name] [name]                        MRN:          [id number]  DATE:03/02/2006                            DOB:          [birth year]    PAST MEDICAL HISTORY:  1.  Hyperlipidemia.  2.  Coronary artery disease, status post acute myocardial infarction with      stents placed to the LAD and RCA, ejection fraction 53%.  3.  Positive family history for coronary artery disease.   MEDICATIONS:  1.  Aspirin 325 mg daily.  2.  Protonix 40 mg daily.  3.  Altace 10 mg daily.  4.  Surfak 1 tablet daily.  5.  Fibercon twice daily.  6.  Allegra 180 mg daily.  7.  Niaspan 2 g at bedtime.  8.  Lipitor 40 mg daily.  9.  Zetia 10 mg daily.  10. Allopurinol 100 mg daily.   VITAL SIGNS:  Weight 234 pounds, blood pressure 118/70, heart rate 86.   LABORATORY DATA:  Total cholesterol 110, triglycerides 78, HDL 36, LDL 58,  LFTs within normal limits.   ASSESSMENT:  [name] [name] is a very pleasant young gentleman who returns to  the lipid clinic today with no chest pain, no shortness of breath, no muscle  aches or pains.  He is compliant with a heart healthy diet.  He eats a lot  of vegetables, very lean protein, very little snacking, and very rarely  fried foods.  He had been exercising 4 to 5 times a week by walking on the  treadmill about 2 miles at a time; however, his life got hectic for a while  he said and so he stopped his exercise.  He is up about 4 pounds since his  last visit and realizes that he needs to restart his exercise regimen.  He  recently saw his brother who has bilateral renal artery stenosis.  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  He has  hyperlipidemia.  He is 10 years older than Mr. Wingert, and Mr. Epps  says he is not going to end up like his brother.  He has vowed to restart an  exercise regimen and to continue his heart healthy  diet.  His lipid panel is  at goal.  Total cholesterol is a goal of less than 200, triglycerides a goal  of less than 150, HDL less than goal of greater than 40, LDL a goal of less  than 70, non-HDL cholesterol at goal of less than 100.   PLAN:  1.  Continue current medication regimen.  2.  Continue heart healthy diet.  3.  Restart exercise regimen.  He will restart by walking one mile on the      treadmill 3 times a week and increase this to 4 to 5 times a week,      walking about 2 miles once he has established the routine.  4.  Followup visit in 6 months.  I had planned on seeing him sooner to  encourage his exercise regimen, although he does promise to continue      this lifestyle modification and would like to come back in 6 months'      time.                                  Leota Sauers, PharmD    LC/MedQ  DD:  03/02/2006  DT:  03/02/2006  Job #:  [id number]

## 2010-12-24 NOTE — Cardiovascular Report (Signed)
[name] [name], [name] [name]                           ACCOUNT NO.:  [id number]   MEDICAL RECORD NO.:  [id number]                   PATIENT TYPE:  INP   LOCATION:  2021                                 FACILITY:  [facility]   PHYSICIAN:  Bruce R. Juanda Chance, M.D. [facility]           DATE OF BIRTH:  30-Apr-1956   DATE OF PROCEDURE:  03/26/2002  DATE OF DISCHARGE:                              CARDIAC CATHETERIZATION   PROCEDURE PERFORMED:  Cardiac catheterization.   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 He had stents placed to the LAD and  right coronary artery by Dr. Gerri Spore in July of 2002.  Six months later he  had a scan which showed a borderline scan.  I saw him back in followup six  months later and we did another followup scan, which showed very  questionable minimal redistribution at the apex.  After that, he came in  with chest pain, which was concerning for unstable angina and was scheduled  for catheterization today.   DESCRIPTION OF PROCEDURE:  The procedure was performed via the right femoral  artery using an arterial sheath and 6 French preformed coronary catheters.  A front wall arterial puncture was performed  and Omnipaque contrast was  used.  The patient tolerated the procedure well and left the laboratory in  satisfactory condition.   RESULTS:  The left main coronary artery:  The left main coronary artery was  free of significant disease.   Left anterior descending:  The left anterior descending artery gave rise to  two diagonal branches and a septal perforator. There was 40% narrowing  within the stent in the mid left anterior descending artery.  There was no  other significant obstruction.   Circumflex artery:  The circumflex artery gave rise to an atrial branch, a  very small marginal branch, a large marginal branch, and two posterolateral  branches.  These vessels were free of significant disease.   Right  coronary artery:  The right coronary is a moderately large vessel that  gave rise to two right ventricular branches, a posterior descending branch,  and three posterolateral branches. There was 40% narrowing in the proximal  vessel. There was less than 10% narrowing at the stent site in the distal  vessel. The first large posterolateral branch had 40% proximal narrowing.   LEFT VENTRICULOGRAPHY:  The left ventriculogram was performed in the RAO  projection showed good wall motion with no areas of hypokinesis.  The  estimated ejection fraction was 60%.   CONCLUSIONS:  Coronary artery disease, status post prior percutaneous  coronary interventions as described with 40% narrowing at the stent site in  the mid left anterior descending artery, no significant obstructive disease  in the circumflex artery, 40% narrowing in the proximal right coronary  artery, less than 10% narrowing at the stent site in the distal right  coronary  artery and 40% narrowing in the posterolateral branch of the right  coronary artery with normal left ventricular function.    RECOMMENDATIONS:  None of the lesions appear to be flow-limiting. I suspect  the patient's symptoms were not ischemic. We will plan reassurance,  continued secondary risk factor modification and I will discuss with the  patient regarding further evaluation of his symptoms.                                                    Bruce Elvera Lennox Juanda Chance, M.D. [facility]    BRB/MEDQ  D:  03/26/2002  T:  03/28/2002  Job:  [id number]   cc:   Teena Irani. Arlyce Dice, M.D.   Cardiopulmonary Laboratory

## 2010-12-24 NOTE — H&P (Signed)
[name], [name]                           ACCOUNT NO.:  [id number]   MEDICAL RECORD NO.:  [id number]                   PATIENT TYPE:  INP   LOCATION:  2021                                 FACILITY:  [facility]   PHYSICIAN:  Pricilla Riffle, M.D. [facility]             DATE OF BIRTH:  [birth year]   DATE OF ADMISSION:  03/25/2002  DATE OF DISCHARGE:                                HISTORY & PHYSICAL   The patient is a 55 year old gentleman with history of known coronary artery  disease who comes in for evaluation of chest pain.   HISTORY OF PRESENT ILLNESS:  The patient's history of coronary artery  disease dates back to 2002.  He presented with unstable angina.  Cardiac  catheterization revealed a 99% mid LAD lesion and an 80% distal RCA lesion.  The patient went on to have PTCA stent placement of these vessels.  He did  well post procedure.  In July he was seen in clinic by Dr. Charlies Constable and  complained at that time of some chest pressure, a couple episodes that were  rather vague.  He was set up then for a Cardiolite scan on 03/19/2002.  The  patient exercise for 9 minutes to a peak heart rate of 174 which was 98%  predicted maximal.  He complained of no chest pain.  EKG showed no  diagnostic changes.  Cardiolite scan showed trivial apical defect,  borderline significance, probable normal perfusion.  Probable trivial  ischemia, questionable significance.  LVF was calculated at 53%.   The patient has no longer any more episodes of chest pressure.  He has been  doing activities.  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   Today he was sitting at the computer and developed chest pain that radiated  to his left arm.  He took a nitroglycerin, took another nitroglycerin and  the pain began to ease.  Currently he is pain free.  Denies shortness of  breath.  Otherwise some exercise tolerance has denoted no decline although  he has not done  a lot of peak exertion.   CURRENT MEDICATIONS:  Lipitor 40 mg q.d., Niaspan 1.5 q.d., Protonix 40  q.o.d., Altace 10 q.d., aspirin 500 q.d., Flomax q.d., FiberCon q.d., Surfak  q.d., and Allegra b.i.d.   PAST MEDICAL HISTORY:  1. CAD.  2. Hypertension.  3. Dyslipidemia.  4. GERD.   SOCIAL HISTORY:  The patient has a 5-pack-year history of smoking, quit 13  years ago.   FAMILY HISTORY:  Positive for CAD.   REVIEW OF SYMPTOMS:  HEENT:  Negative.  CONSTITUTIONAL:  Negative.  CARDIAC:  As noted. GI:  Negative.  History of reflux as noted, has been quiescent.  GU:  Negative.  ENDOCRINE:  No history of diabetes.   PHYSICAL EXAMINATION:  GENERAL:  Patient currently in no acute  distress.  VITAL SIGNS:  Blood pressure is 123/75, pulse is 72.  Weight not taken.  Afebrile.  NECK:  JVP is normal.  No bruits.  LUNGS:  Clear.  No significant wheezes.  CARDIAC:  Regular rate and rhythm.  Normal S1, S2.  No S3, S4 or rub.  No  significant murmurs.  PMI not displaced.  ABDOMEN:  No hepatomegaly.  Benign.  CHEST:  Nontender.  EXTREMITIES:  2+ pulses distally.  No edema.   EKG:  Twelve lead EKG shows normal sinus rhythm at a rate of 72 beats per  minute.   IMPRESSION:  The patient is a 55 year old gentleman with a history of  coronary artery disease status post percutaneous transluminal coronary  angioplasty to the left anterior descending and right coronary artery.  Most  recent Cardiolite just a few days ago was negative for ischemia.  The  patient now notes, however, new chest pain similar to what he had prior to  the angioplasty.  He has not had any more chest pressure as he did in July.   I am not convinced that this represents cardiac ischemia but would go ahead  and treat him as if this is.  Will begin nitro and Lovenox.  Continue other  medicines.  Will discuss with Dr. Charlies Constable.  May, given these changes  and the recent negative tests, go ahead and schedule a cardiac   catheterization to confirm his anatomy.                                               Pricilla Riffle, M.D. [facility]    PVR/MEDQ  D:  03/25/2002  T:  03/27/2002  Job:  [id number]

## 2010-12-24 NOTE — Cardiovascular Report (Signed)
[name] [name]. [facility]  Patient:    [name] [name], [name] [name]                        MRN: [id number] Proc. Date: 03/06/01 Adm. Date:  [id number] Attending:  Daisey Must                        Cardiac Catheterization  DATE OF BIRTH:  1956/04/14  REFERRING PHYSICIAN:  Teena Irani. Arlyce Dice, M.D.  Corinda Gubler CARDIOLOGIST:  Daisey Must, M.D.  INDICATIONS:  The patient is a 55 year old gentleman with hypertension, dyslipidemia and family history of premature coronary artery disease, who presents with unstable angina.  He is referred for cardiac catheterization to define his coronary anatomy.  PROCEDURES PERFORMED:  Left heart catheterization, selective coronary angiography, left ventriculography.  DESCRIPTION OF PROCEDURE:  After informed consent was obtained, the patient as taken to the cardiac catheterization lab.  He was prepped and draped in the usual sterile fashion, and the area about the right femoral artery was anesthetized with 1% lidocaine.  A 6 French sheath was placed in the femoral artery via the modified Seldinger technique.  Selective coronary angiography was performed using JL4 and JR4 catheters.  A left ventriculogram was performed using a angled pigtail catheter.  The patient tolerated the procedure well without obvious complications.  HEMODYNAMICS:  Left ventricle 107/19 (post angiography).  Aorta 107/65.  ANGIOGRAPHIC FINDINGS: 1. The left main is free of significant flow-limiting coronary artery disease. 2. The left anterior descending is a medium caliber vessel which gives rise to    two diagonal branches.  There is a 95% stenosis at the mid vessel level    just proximal to the second diagonal branch.  There is TIMI-3 flow    distally. 3. The circumflex is a medium caliber vessel which gives rise to a large    bifurcating obtuse marginal branch.  There is a 20% distal circumflex    stenosis. 4. The right coronary artery is a medium caliber  vessel.  Pressure damping    was noted with engagement of the right coronary artery and in fact there    was some degree of spasm proximally with intrinsic ostial atherosclerosis    of approximately 20-30%.  There was an 80% stenosis in the mid to distal    vessel as well as a 50-60% stenosis in the posterior descending branch.    The coronary system is right dominant.  LEFT VENTRICULOGRAPHY:  The left ventriculography reveals an estimated ejection fraction of 50% with mild anteroapical hypokinesis.  There is no evidence of significant mitral regurgitation.  CONCLUSIONS AND RECOMMENDATIONS: 1. Two-vessel obstruction coronary artery disease involving the mid    left anterior descending and mid to distal right coronary artery. 2. Mild left ventricular dysfunction with mild anteroapical    hypokinesis and an overall estimated ejection fraction of 50%.    No evidence of significant mitral regurgitation noted. 3. Will plan to proceed with percutaneous coronary intervention to address    the left anterior descending and right coronary artery stenoses. DD:  03/06/01 TD:  03/06/01 Job: [id number]

## 2010-12-24 NOTE — Discharge Summary (Signed)
[facility]  Patient:    [name] [name], [name] [name]                        MRN: [id number] Adm. Date:  [id number] Disc. Date: 03/08/01 Attending:  Daisey Must Dictator:   Joellyn Rued, P.A.-C. CC:         Dr. Arlyce Dice of [facility]   Referring Physician Discharge [place]  DATE OF BIRTH:  1955-10-21  SUMMARY OF HISTORY:  Mr. Schuh is a 55 year old white male who developed left anterior chest discomfort radiating to the left upper extremity and elbow approximately one month ago relieved with rest.  The worst episode was approximately one week ago after walking up a hill.  Over the last two to three days he has had similar symptoms at rest.  He has been taking Rolaids for indigestion; however, the discomfort is different than indigestion.  He has never used nitroglycerin before.  He was awakened on the morning of admission with recurrent chest discomfort and he drove himself to the emergency room.  His EKG did not show any acute changes, and after receiving sublingual nitroglycerin in the emergency room he was pain free.  He has multiple risk factors for coronary artery disease which include hyperlipidemia, hypertension, remote tobacco use.  LABORATORY DATA:  On July 30, fasting lipids were 204, triglycerides 257, HDL 34, LDL 119.  Admission CK was 693 with a negative MB and relative index and a negative troponin.  Subsequent CK totals and relative indexes and troponins were negative for myocardial infarction.  However, post angioplasty stenting his CK-MBs were slightly elevated at 311 and 15.2 with a relative index of 4.9.  Admission sodium was 138, potassium 4.1, BUN 13, creatinine 0.9, glucose 106.  His AST and ALT were slightly elevated on July 31 at 44 and 54.  H&H were 13.8 and 38.6, normal indices, platelets 294, wbcs 3.9.  PT 13.2, PTT 28.  EKG showed normal sinus rhythm, nonspecific ST-T wave changes, early R.  Prior to discharge  H&H were 12.7 and 35.2, platelets 292, wbcs 7.1.  CK-MBs were declining.  HOSPITAL COURSE:  Mr. Hasler was admitted to the hospital and placed on Lovenox as well as a beta blocker and Protonix.  Overnight, CKs, troponins, and EKGs ruled him out for a non-Q-wave myocardial infarction.  Cardiac catheterization was performed on July 30 by Dr. Diona Browner.  According to his progress notes, this showed a 95% mid LAD, 20% distal circumflex, 40% proximal RCA, 80% mid distal RCA, 50% PDA.  Right side was noted to be dominant.  His ejection fraction was 50% with distal anterior apical hypokinesis.  After reviewing the films Dr. Gerri Spore performed angioplasty stenting to the mid LAD reducing this from 99 to 0%, and angioplasty stenting to the distal RCA reducing this from 80 to 0%.  He was entered into the REPLACE study.  Post sheath removal and bedrest, catheterization site was intact and he was ambulating the halls without difficulty.  With his slightly elevated CK-MBs, Dr. Gerri Spore felt that he should remain in the hospital another 24 hours.  By the morning of August 1, Dr. Gerri Spore felt that he could be discharged home.  DISCHARGE DIAGNOSES: 1. Unstable angina status post angioplasty stenting of the left anterior    descending artery and right coronary artery as previously described. 2. Hyperlipidemia.  DISPOSITION:  He is discharged home.  MEDICATIONS: 1. He received new prescriptions:    a.  Plavix 75 mg q.d. for four weeks.    b. Toprol-XL 100 mg q.d.    c. Altace 2.5 mg q.d.    d. Sublingual nitroglycerin 0.4 as needed.    e. Protonix 40 mg q.d. 2. His Lipitor was increased to 40 mg q.h.s. 3. He was asked to continue:    a. Aspirin 325 mg q.d.    b. Niaspan 1500 mg q.d. 4. He was advised not to take his Lotrel.  ACTIVITY:  He was told no lifting, driving, sexual activity, or heavy exertion for one week and then he was given permission to return to work.  DIET:  Maintain low  salt/fat/cholesterol diet.  SPECIAL INSTRUCTIONS:  If he had any problems with his catheterization site or questions he was asked to call our office.  He will also need fasting lipids and LFTs in approximately six weeks to reevaluate increasing his Lipitor, history of hyperlipidemia, and his slightly elevated AST and ALT during this admission.  FOLLOW-UP:  He will see Dr. Gerri Spore on Thursday, August 22 at 10:15 a.m. DD:  03/08/01 TD:  03/08/01 Job: [id number]

## 2010-12-24 NOTE — Cardiovascular Report (Signed)
NAMEEARLE, [name] [name]               ACCOUNT NO.:  [id number]   MEDICAL RECORD NO.:  [id number]          PATIENT TYPE:  INP   LOCATION:  6533                         FACILITY:  [facility]   PHYSICIAN:  Bevelyn Buckles. Bensimhon, MDDATE OF BIRTH:  November 18, 1955   DATE OF PROCEDURE:  06/27/2006  DATE OF DISCHARGE:                              CARDIAC CATHETERIZATION   CARDIOLOGIST:  Dr. Juanda Chance.   Mr. [name] [name] is a 55 year old male with a previous stent to his LAD and RCA.  He was admitted with an episode of chest pain.  He has ruled out for  myocardial infarction with serial cardiac markers.  EKG was non-acute.   PROCEDURES PERFORMED:  1. Selective coronary angiography.  2. Left heart catheterization.  3. Left ventriculogram.  4. Selective renal angiography, bilateral at patient's request.  5. Angio-Seal closure device.   DESCRIPTION OF PROCEDURE:  The risks and benefits of the catheterization  perform.  Consent was signed and placed on the chart.  A 6-French arterial  sheath was placed in right femoral artery using a modified Seldinger  technique.  Standard catheters including a JL-4, JR-4 and angled pigtail  were used for procedure.  At the patient's request, we did selective  bilateral renal angiography, as his brother has had high-grade renal artery  disease in the past.   There was some catheter dampening with the 6-French JR-4 in the right  coronary.  We thus switched out to a 4-French catheter and treated with  nitroglycerin which resolved spasm.  There were no apparent complications.  During the procedure, patient had his right femoral arteriotomy site sealed  with Angio-Seal closure device.  There was good hemostasis.   HEMODYNAMIC RESULTS:  Central aortic pressure 100/71 with a mean of 83.  LV  pressure 103/0 with an EDP of 5.  There is no aortic stenosis.   Left main was normal.   LAD was a moderate-sized vessel coursing to the apex.  It gave off 2 small  to moderate diagonals.  In  the mid-portion of the LAD, there was a stent  which was widely patent.  There is a 30% stenosis just before leading into  the proximal edge of the stent.   Left circumflex was a moderate-sized system, gave off a tiny ramus, tiny OM-  1, a large OM-2, a large OM-3 and a small posterolateral.  There was also an  atrial branch.  There is no significant coronary disease.   Right coronary artery is a moderate-sized dominant vessel.  There was a  stent in the distal portion prior to the PDA.  There was a moderate-sized  PDA and 3 posterolaterals.  In the proximal portion of the RCA, there was a  20% stenosis.  Within the stent there was a mild luminal irregularity.  In  the mid-portion of the PDA, there is a 50% stenosis.   Left ventriculogram done in the RAO position showed an EF of 60% with no  wall motion abnormalities.   Renal arteries bilaterally were patent.   ASSESSMENT:  1. Mild nonobstructive coronary artery disease number described above.  2. Patent  coronary stents.  3. Normal LV function.  4. Normal renal arteries.   PLAN/DISCUSSION:  I suspect Mr. [name] [name]'s chest pain is likely non-cardiac.  Would consider possible reflux with related esophageal spasm.  Would  continue aggressive medical therapy of his premature coronary disease.  He  will be suitable for discharge later today, if his groin remains stable.      Bevelyn Buckles. Bensimhon, MD  Electronically Signed     DRB/MEDQ  D:  06/27/2006  T:  06/27/2006  Job:  [id number]   cc:   Everardo Beals. Juanda Chance, MD, [facility]

## 2011-01-26 ENCOUNTER — Other Ambulatory Visit: Payer: Self-pay | Admitting: *Deleted

## 2011-01-26 MED ORDER — PANTOPRAZOLE SODIUM 40 MG PO TBEC: 40.0000 mg | DELAYED_RELEASE_TABLET | Freq: Every day | ORAL | Status: DC

## 2011-04-06 ENCOUNTER — Ambulatory Visit: Payer: Self-pay | Admitting: Internal Medicine

## 2011-09-12 ENCOUNTER — Observation Stay (HOSPITAL_COMMUNITY)
Admission: AD | Admit: 2011-09-12 | Discharge: 2011-09-13 | Disposition: A | Discharge status: Home / Self Care | Payer: BC Managed Care – PPO | Source: Ambulatory Visit | Attending: Urology | Admitting: Urology

## 2011-09-12 ENCOUNTER — Other Ambulatory Visit: Payer: Self-pay | Admitting: Urology

## 2011-09-12 ENCOUNTER — Encounter (HOSPITAL_COMMUNITY): Payer: Self-pay | Admitting: *Deleted

## 2011-09-12 DIAGNOSIS — Y838 Other surgical procedures as the cause of abnormal reaction of the patient, or of later complication, without mention of misadventure at the time of the procedure: Secondary | ICD-10-CM | POA: Insufficient documentation

## 2011-09-12 DIAGNOSIS — Z8639 Personal history of other endocrine, nutritional and metabolic disease: Secondary | ICD-10-CM | POA: Insufficient documentation

## 2011-09-12 DIAGNOSIS — I10 Essential (primary) hypertension: Secondary | ICD-10-CM | POA: Insufficient documentation

## 2011-09-12 DIAGNOSIS — Z79899 Other long term (current) drug therapy: Secondary | ICD-10-CM | POA: Insufficient documentation

## 2011-09-12 DIAGNOSIS — N400 Enlarged prostate without lower urinary tract symptoms: Secondary | ICD-10-CM

## 2011-09-12 DIAGNOSIS — K219 Gastro-esophageal reflux disease without esophagitis: Secondary | ICD-10-CM | POA: Insufficient documentation

## 2011-09-12 DIAGNOSIS — G4730 Sleep apnea, unspecified: Secondary | ICD-10-CM | POA: Insufficient documentation

## 2011-09-12 DIAGNOSIS — I2510 Atherosclerotic heart disease of native coronary artery without angina pectoris: Secondary | ICD-10-CM | POA: Insufficient documentation

## 2011-09-12 DIAGNOSIS — R109 Unspecified abdominal pain: Principal | ICD-10-CM | POA: Insufficient documentation

## 2011-09-12 DIAGNOSIS — E7800 Pure hypercholesterolemia, unspecified: Secondary | ICD-10-CM | POA: Insufficient documentation

## 2011-09-12 DIAGNOSIS — I252 Old myocardial infarction: Secondary | ICD-10-CM | POA: Insufficient documentation

## 2011-09-12 DIAGNOSIS — Z862 Personal history of diseases of the blood and blood-forming organs and certain disorders involving the immune mechanism: Secondary | ICD-10-CM | POA: Insufficient documentation

## 2011-09-12 DIAGNOSIS — M129 Arthropathy, unspecified: Secondary | ICD-10-CM | POA: Insufficient documentation

## 2011-09-12 HISTORY — DX: Sleep apnea, unspecified: G47.30

## 2011-09-12 HISTORY — DX: Acute myocardial infarction, unspecified: I21.9

## 2011-09-12 HISTORY — DX: Angina pectoris, unspecified: I20.9

## 2011-09-12 HISTORY — DX: Essential (primary) hypertension: I10

## 2011-09-12 HISTORY — DX: Gastro-esophageal reflux disease without esophagitis: K21.9

## 2011-09-12 MED ORDER — DEXTROSE-NACL 5-0.45 % IV SOLN
INTRAVENOUS | Status: DC
  Administered 2011-09-12 – 2011-09-13 (×2): via INTRAVENOUS

## 2011-09-12 MED ORDER — OXYBUTYNIN CHLORIDE 5 MG PO TABS
5.0000 mg | ORAL_TABLET | Freq: Three times a day (TID) | ORAL | Status: DC | PRN
  Administered 2011-09-12: 5 mg via ORAL
  Filled 2011-09-12 (×2): qty 1

## 2011-09-12 MED ORDER — ONDANSETRON HCL 4 MG/2ML IJ SOLN: 4.0000 mg | Freq: Four times a day (QID) | INTRAMUSCULAR | Status: DC | PRN

## 2011-09-12 MED ORDER — HYDROCODONE-ACETAMINOPHEN 5-325 MG PO TABS
1.0000 | ORAL_TABLET | ORAL | Status: DC | PRN
  Administered 2011-09-13: 2 via ORAL
  Filled 2011-09-12: qty 2

## 2011-09-12 MED ORDER — HYDROMORPHONE HCL PF 1 MG/ML IJ SOLN
0.5000 mg | INTRAMUSCULAR | Status: DC | PRN
  Administered 2011-09-12 (×5): 1 mg via INTRAVENOUS
  Filled 2011-09-12 (×2): qty 1
  Filled 2011-09-12: qty 20
  Filled 2011-09-12 (×3): qty 1

## 2011-09-12 MED ORDER — LEVOFLOXACIN 500 MG PO TABS
500.0000 mg | ORAL_TABLET | Freq: Every day | ORAL | Status: DC
  Filled 2011-09-12: qty 1

## 2011-09-12 NOTE — H&P (Signed)
  [name] [name] is an 56 y.o. male.    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 The patient stopped the Uroxatral because of the ejaculatory disturbances. He has a history of BPH, with increased in his LUT S. Symptoms score. The patient was recently on a cruise, where he developed urinary retention, with catheterization for 350 cc. He complains of frequency, urgency, and has undergone cooled microwave thermotherapy.  Post thermotherapy, the patient complains of suprapubic pain, and perineal pain. He was treated preoperatively with Vicodin, as well as anti-spasmodic, and Valium. Postoperatively, the patient was treated with additional lidocaine intravesical therapy, Demerol 75 mg and Phenergan 25 mg, and gel leak at that spasmodic. However, the patient continues to complain of pain, and after discussion with the patient's wife and son, I have decided to admit him for observation overnight for pain control, IV hydration, and nausea treatment.  Past medical history: #1 MI #2 sleep apnea #3 arthritis #4 catheter stent placement #5 coronary artery disease #6 GERD #7 gout #8 elevated cholesterol #9 hypertension.   Past surgical history: Knee arthroscopy, left knee replacement.  Medications Prior to Admission  Medication Sig Dispense Refill  . pantoprazole (PROTONIX) 40 MG tablet Take 1 tablet (40 mg total) by mouth daily.  30 tablet  1   No current facility-administered medications on file as of 09/12/2011.  Additional medications: Zetia #2 vitamin C #3 Protonix #4 Niaspan #5 multivitamins #6 Lipitor 40 mg per day #7 aspirin 81 mg a day and rate all case #9 allopurinol #10 Aleve #11 laxative, fiber.  Allergies:  Allergies  Allergen Reactions  . Sulfamethoxazole     REACTION: rash, itchy    Family history: #1 paternal history cancer #2 Vicryl history kidney  stones #3 married with one daughter and one son.  Social History: Father died age 44 secondary to cancer mother died 14 Alzheimer's.  Tobacco: One half pack per day x10 years. None x17 years.  Review of Systems: Pertinent items are noted in HPI. A comprehensive review of systems was negative except for:  Frequency, urgency, dribbling, urinary retention, suprapubic pain.    @VSRANGES @  Physical Exam: General appearance: alert and appears stated age Head: Normocephalic, without obvious abnormality, atraumatic Eyes: conjunctivae/corneas clear. EOM's intact.  Oropharynx: moist mucous membranes Neck: supple, symmetrical, trachea midline Resp: normal respiratory effort Cardio: regular rate and rhythm Back: symmetric, no curvature. ROM normal. No CVA tenderness. GI: soft, non-tender; bowel sounds normal; no masses,  no organomegaly Male genitalia: penis: normal male phallus with no lesions or discharge.Testes: bilaterally descended with no masses or tenderness. no hernias Pelvic: deferred Extremities: extremities normal, atraumatic, no cyanosis or edema Skin: Skin color normal. No visible rashes or lesions Neurologic: Grossly normal  Assessment/Plan The patient has a significant pain after cooled microwave thermotherapy. He is not able to be helped with oral pain medications or intramuscular payment patient. I will admit him to the hospital for IV hydration and pain control. He has Foley catheter in place for 2 days. Shakeerah Gradel I 09/12/2011, 1:04 PM

## 2011-09-12 NOTE — Progress Notes (Signed)
Urology Progress Note post op microwave thermotherapy, with post op pain, admitted for 223 hrs for pain control.  Tonight  much improved. No abdominal pain. Foley draining well.   Subjective:     No acute urologic events overnight. Ambulation: none   Flatus:  no   Bowel movement  no   Pain: Pain relief with IV Dilauded  Objective:  Blood pressure 129/82, pulse 81, temperature 97.4 F (36.3 C), temperature source Oral, resp. rate 20, height 5\' 11"  (1.803 m), weight 115.531 kg (254 lb 11.2 oz), SpO2 99.00%.  Physical Exam:  General:  No acute distress, awake ABD: nontender. + BS.  Genitourinary:  Foley  Clear urine. Foley: good position. Clear urine.     I/O last 3 completed shifts: In: 735 [P.O.:360; I.V.:375] Out: 500 [Urine:500]  No results found for this basename: HGB:2,WBC:2,PLT:2 in the last 72 hours  No results found for this basename: NA:2,K:2,CL:2,CO2:2,BUN:2,CREATININE:2,CALCIUM:2,MAGNESIUM:2,GFRNONAA:2,GFRAA:2 in the last 72 hours   No results found for this basename: PT:2,INR:2,APTT:2 in the last 72 hours   No components found with this basename: ABG:2  Assessment/Plan: Pain much improved. Heat to s-p area has also helped.  P: D/C in AM.   Continue any current medications.

## 2011-09-13 NOTE — Discharge Summary (Signed)
Physician Discharge Summary  Patient ID: LUC SHAMMAS MRN: [id number] DOB/AGE: 08/22/1955 56 y.o.  Admit date: 09/12/2011 Discharge date: 09/13/2011  Admission Diagnoses: pain post cooled microwave prostatic thermotherapy  Discharge Diagnoses: same, with resolved pain Active Problems:  * No active hospital problems. *    Discharged Condition: stable  Hospital Course: IV hydration and pain control  Consults: None  Significant Diagnostic Studies  Treatments: IV hydration and pain control  Discharge Exam: Blood pressure 139/84, pulse 94, temperature 98.6 F (37 C), temperature source Oral, resp. rate 16, height 5\' 11"  (1.803 m), weight 115.531 kg (254 lb 11.2 oz), SpO2 91.00%.   Disposition:    Medication List  As of 09/13/2011  8:38 AM   ASK your doctor about these medications         acetaminophen-codeine 300-30 MG per tablet   Commonly known as: TYLENOL #3   Take 1-2 tablets by mouth every 4 (four) hours as needed. For pain      aspirin EC 325 MG tablet   Take 325 mg by mouth daily.      docusate sodium 100 MG capsule   Commonly known as: COLACE   Take 300 mg by mouth daily.      fexofenadine 180 MG tablet   Commonly known as: ALLEGRA   Take 180 mg by mouth daily.      mulitivitamin with minerals Tabs   Take 1 tablet by mouth daily.      niacin 1000 MG CR tablet   Commonly known as: NIASPAN   Take 2,000 mg by mouth at bedtime.      omeprazole 20 MG capsule   Commonly known as: PRILOSEC   Take 20 mg by mouth daily.      polycarbophil 625 MG tablet   Commonly known as: FIBERCON   Take 625 mg by mouth daily.      pseudoephedrine 30 MG tablet   Commonly known as: SUDAFED   Take 30 mg by mouth 2 (two) times daily.      ramipril 10 MG tablet   Commonly known as: ALTACE   Take 10 mg by mouth daily.      vitamin C 1000 MG tablet   Take 1,000 mg by mouth daily.           Follow-up Information    Follow up with Izumi Mixon I, MD in 1 day.  (voiding trial)    Contact information:   [address] [place], [address] [facility] [place] [address] [phone]          Signed: Jethro Bolus I 09/13/2011, 8:38 AM

## 2013-11-15 ENCOUNTER — Telehealth: Payer: Self-pay | Admitting: *Deleted

## 2013-11-15 NOTE — Telephone Encounter (Signed)
Pt called needing a copy of his CPAP results done by Dr. Harlen LabsYoung.//AB/CMA

## 2013-11-15 NOTE — Telephone Encounter (Signed)
Called and spoke with the pt and informed him that he will need to call medical records and they will be able to get a copy for him.  Pt agreed and he was given the # to medical records.//AB/CMA

## 2014-05-21 ENCOUNTER — Encounter: Payer: Self-pay | Admitting: Cardiology

## 2014-05-22 ENCOUNTER — Encounter: Payer: Self-pay | Admitting: Cardiology

## 2015-07-22 ENCOUNTER — Encounter: Payer: Self-pay | Admitting: Cardiology

## 2017-03-26 ENCOUNTER — Emergency Department
Admission: EM | Admit: 2017-03-26 | Discharge: 2017-03-26 | Disposition: A | Discharge status: Home / Self Care | Payer: 59 | Attending: Emergency Medicine | Admitting: Emergency Medicine

## 2017-03-26 ENCOUNTER — Encounter: Payer: Self-pay | Admitting: Emergency Medicine

## 2017-03-26 DIAGNOSIS — Z7982 Long term (current) use of aspirin: Secondary | ICD-10-CM | POA: Insufficient documentation

## 2017-03-26 DIAGNOSIS — R55 Syncope and collapse: Secondary | ICD-10-CM | POA: Insufficient documentation

## 2017-03-26 DIAGNOSIS — I10 Essential (primary) hypertension: Secondary | ICD-10-CM | POA: Insufficient documentation

## 2017-03-26 DIAGNOSIS — Z79899 Other long term (current) drug therapy: Secondary | ICD-10-CM | POA: Diagnosis not present

## 2017-03-26 DIAGNOSIS — F17210 Nicotine dependence, cigarettes, uncomplicated: Secondary | ICD-10-CM | POA: Insufficient documentation

## 2017-03-26 LAB — BASIC METABOLIC PANEL
Anion gap: 11 (ref 5–15)
BUN: 14 mg/dL (ref 6–20)
CHLORIDE: 102 mmol/L (ref 101–111)
CO2: 25 mmol/L (ref 22–32)
CREATININE: 0.99 mg/dL (ref 0.61–1.24)
Calcium: 9.6 mg/dL (ref 8.9–10.3)
GFR calc non Af Amer: >60 mL/min (ref >60)
Glucose, Bld: 123 mg/dL — ABNORMAL HIGH (ref 65–99)
POTASSIUM: 3.6 mmol/L (ref 3.5–5.1)
SODIUM: 138 mmol/L (ref 135–145)

## 2017-03-26 LAB — CBC
HEMATOCRIT: 37.2 % — AB (ref 40.0–52.0)
HEMOGLOBIN: 13.4 g/dL (ref 13.0–18.0)
MCH: 32.6 pg (ref 26.0–34.0)
MCHC: 35.9 g/dL (ref 32.0–36.0)
MCV: 90.8 fL (ref 80.0–100.0)
PLATELETS: 296 10*3/uL (ref 150–440)
RBC: 4.1 MIL/uL — AB (ref 4.40–5.90)
RDW: 13.2 % (ref 11.5–14.5)
WBC: 10.4 10*3/uL (ref 3.8–10.6)

## 2017-03-26 MED ORDER — SODIUM CHLORIDE 0.9 % IV BOLUS (SEPSIS)
500.0000 mL | Freq: Once | INTRAVENOUS | Status: AC
  Administered 2017-03-26: 500 mL via INTRAVENOUS

## 2017-03-26 NOTE — ED Triage Notes (Signed)
Patient to ED via [facility]. Reports patient and wife were eating dinner at [facility] and when they went to leave his wife had a near syncopal episode. After this happened, patient reports he went to stand up and felt like he was going to pass out. States he sat down on the floor and then remembers waking up laying on the floor. Denies fall or injury. Patient alert and oriented x4 upon arrival.

## 2017-03-26 NOTE — ED Provider Notes (Signed)
[facility] Emergency Department Provider Note ____________________________________________   First MD Initiated Contact with Patient 03/26/17 1835     (approximate)  I have reviewed the triage vital signs and the nursing notes.   HISTORY  Chief Complaint Near Syncope    HPI KAILO KOSIK is a 61 y.o. male History of GERD and CAD who presents with syncope acute onset preceded by feeling lightheaded and hot and occurring after he ate dinner.  Per patient he has with her just eaten dinner and after getting up his wife had a syncopal episode. She states that when EMS was attending to his wife he then began to feel dizzy laid down and passed out. Denies head trauma. He denies chest pain or difficulty breathing. He states his blood pressures have been slightly low since he's been on a blood pressure medication but he's had no significant syncopal history before. Now feeling back to baseline.  Past Medical History:  Diagnosis Date  . Angina   . GERD (gastroesophageal reflux disease)   . Hypertension   . Myocardial infarction (HCC)   . Sleep apnea     There are no active problems to display for this patient.   Past Surgical History:  Procedure Laterality Date  . CORONARY STENT PLACEMENT  2002  . JOINT REPLACEMENT  March and June 2011   Left and Right knee  . ROTATOR CUFF REPAIR      Prior to Admission medications   Medication Sig Start Date End Date Taking? Authorizing Provider  acetaminophen-codeine (TYLENOL #3) 300-30 MG per tablet Take 1-2 tablets by mouth every 4 (four) hours as needed. For pain    [provider]  Ascorbic Acid (VITAMIN C) 1000 MG tablet Take 1,000 mg by mouth daily.    [provider]  aspirin EC 325 MG tablet Take 325 mg by mouth daily.    [provider]  docusate sodium (COLACE) 100 MG capsule Take 300 mg by mouth daily.    [provider]  fexofenadine (ALLEGRA) 180 MG tablet Take 180 mg  by mouth daily.    [provider]  Multiple Vitamin (MULITIVITAMIN WITH MINERALS) TABS Take 1 tablet by mouth daily.    [provider]  niacin (NIASPAN) 1000 MG CR tablet Take 2,000 mg by mouth at bedtime.    [provider]  omeprazole (PRILOSEC) 20 MG capsule Take 20 mg by mouth daily.    [provider]  polycarbophil (FIBERCON) 625 MG tablet Take 625 mg by mouth daily.    [provider]  pseudoephedrine (SUDAFED) 30 MG tablet Take 30 mg by mouth 2 (two) times daily.    [provider]  ramipril (ALTACE) 10 MG tablet Take 10 mg by mouth daily.    [provider]    Allergies Sulfamethoxazole  No family history on file.  Social History Social History  Substance Use Topics  . Smoking status: Former Smoker    Types: Cigarettes    Quit date: 08/08/1988  . Smokeless tobacco: Not on file  . Alcohol use Yes     Comment: occasional    Review of Systems  Constitutional: No fever/chills Eyes: No visual changes. ENT: No sore throat. Cardiovascular: Denies chest pain. Positive for syncope. Respiratory: Denies shortness of breath. Gastrointestinal: No nausea, no vomiting.  No diarrhea.  Genitourinary: Negative for dysuria.  Musculoskeletal: Negative for back pain. Skin: Negative for rash. Neurological: Negative for headaches, focal weakness or numbness.   ____________________________________________  PHYSICAL EXAM:  VITAL SIGNS: ED Triage Vitals [03/26/17 1823]  Enc Vitals Group     BP      Pulse      Resp      Temp 97.8 F (36.6 C)     Temp Source Oral     SpO2      Weight 225 lb (102.1 kg)     Height 5\' 10"  (1.778 m)     Head Circumference      Peak Flow      Pain Score      Pain Loc      Pain Edu?      Excl. in GC?     Constitutional: Alert and oriented. Well appearing and in no acute distress. Eyes: Conjunctivae are normal.  EOMI. PERRL.  Head: Atraumatic. Nose: No  congestion/rhinnorhea. Mouth/Throat: Mucous membranes are moist.   Neck: Normal range of motion.  Cardiovascular: Normal rate, regular rhythm. Grossly normal heart sounds.  Good peripheral circulation. Respiratory: Normal respiratory effort.  No retractions. Lungs CTAB. Gastrointestinal: Soft and nontender. No distention.  Genitourinary: No CVA tenderness. Musculoskeletal: No lower extremity edema.  Extremities warm and well perfused.  Neurologic:  Normal speech and language. No gross focal neurologic deficits are appreciated. Motor intact in all extremities. Normal coordination and finger-to-nose. Skin:  Skin is warm and dry. No rash noted. Psychiatric: Mood and affect are normal. Speech and behavior are normal.  ____________________________________________   LABS (all labs ordered are listed, but only abnormal results are displayed)  Labs Reviewed  BASIC METABOLIC PANEL - Abnormal; Notable for the following:       Result Value   Glucose, Bld 123 (*)    All other components within normal limits  CBC - Abnormal; Notable for the following:    RBC 4.10 (*)    HCT 37.2 (*)    All other components within normal limits   ____________________________________________  EKG  ED ECG REPORT I, Dionne Bucy, the attending physician, personally viewed and interpreted this ECG.  Date: 03/26/2017 EKG Time: 1740 Rate: 69 Rhythm: normal sinus rhythm QRS Axis: normal Intervals: normal ST/T Wave abnormalities: T-wave inversion in lead III Narrative Interpretation: no acute findings  ____________________________________________  RADIOLOGY    ____________________________________________   PROCEDURES  Procedure(s) performed: No    Critical Care performed: No ____________________________________________   INITIAL IMPRESSION / ASSESSMENT AND PLAN / ED COURSE  Pertinent labs & imaging results that were available during my care of the patient were reviewed by me and  considered in my medical decision making (see chart for details).  61 year old male presents with syncope after eating at a restaurant and then witnessing his wife had a syncopal event. Patient reports prodrome of feeling hot and lightheaded. Denies alcohol use. Vital signs are normal, patient is well-appearing, and exam is unremarkable. EKG with no concerning acute findings. Overall presentation consistent with vasovagal syncope given the prodrome and precipitating event. Plan for basic labs, fluid bolus, and reassess. Anticipate DC home.     ----------------------------------------- 8:44 PM on 03/26/2017 -----------------------------------------  Lab workup unremarkable and patient is feeling better and wants to go home.  ____________________________________________   FINAL CLINICAL IMPRESSION(S) / ED DIAGNOSES  Final diagnoses:  Syncope and collapse      NEW MEDICATIONS STARTED DURING THIS VISIT:  Discharge Medication List as of 03/26/2017  8:45 PM       Note:  This document was prepared using Dragon voice recognition software and may include unintentional dictation errors.  Dionne Bucy, MD 03/27/17 [phone]
